# Patient Record
Sex: Female | Born: 1980 | Race: White | Hispanic: No | Marital: Married | Smoking: Never smoker
Health system: Southern US, Community
[De-identification: ages and names within clinical notes are randomized; demographics above are authoritative.]

## PROBLEM LIST (undated history)

## (undated) DIAGNOSIS — M329 Systemic lupus erythematosus, unspecified: Secondary | ICD-10-CM

## (undated) DIAGNOSIS — I82409 Acute embolism and thrombosis of unspecified deep veins of unspecified lower extremity: Secondary | ICD-10-CM

## (undated) DIAGNOSIS — D649 Anemia, unspecified: Secondary | ICD-10-CM

## (undated) DIAGNOSIS — IMO0002 Reserved for concepts with insufficient information to code with codable children: Secondary | ICD-10-CM

## (undated) DIAGNOSIS — H109 Unspecified conjunctivitis: Secondary | ICD-10-CM

## (undated) DIAGNOSIS — I319 Disease of pericardium, unspecified: Secondary | ICD-10-CM

## (undated) DIAGNOSIS — Q211 Atrial septal defect: Secondary | ICD-10-CM

## (undated) HISTORY — DX: Disease of pericardium, unspecified: I31.9

## (undated) HISTORY — PX: APPENDECTOMY: SHX54

## (undated) HISTORY — DX: Atrial septal defect: Q21.1

## (undated) HISTORY — DX: Unspecified conjunctivitis: H10.9

---

## 2002-06-21 ENCOUNTER — Encounter: Admission: RE | Admit: 2002-06-21 | Discharge: 2002-06-21 | Payer: Self-pay | Admitting: Internal Medicine

## 2002-06-21 ENCOUNTER — Encounter: Payer: Self-pay | Admitting: Internal Medicine

## 2003-03-09 ENCOUNTER — Encounter: Admission: RE | Admit: 2003-03-09 | Discharge: 2003-03-09 | Payer: Self-pay | Admitting: Family Medicine

## 2003-03-09 ENCOUNTER — Encounter: Payer: Self-pay | Admitting: Family Medicine

## 2003-06-05 ENCOUNTER — Encounter: Admission: RE | Admit: 2003-06-05 | Discharge: 2003-06-05 | Payer: Self-pay | Admitting: Neurosurgery

## 2003-06-21 ENCOUNTER — Encounter: Admission: RE | Admit: 2003-06-21 | Discharge: 2003-06-21 | Payer: Self-pay | Admitting: Neurosurgery

## 2006-04-25 ENCOUNTER — Encounter: Payer: Self-pay | Admitting: Vascular Surgery

## 2006-04-25 ENCOUNTER — Ambulatory Visit (HOSPITAL_COMMUNITY): Admission: RE | Admit: 2006-04-25 | Discharge: 2006-04-25 | Payer: Self-pay | Admitting: Family Medicine

## 2006-05-11 ENCOUNTER — Encounter: Payer: Self-pay | Admitting: Vascular Surgery

## 2006-05-11 ENCOUNTER — Inpatient Hospital Stay (HOSPITAL_COMMUNITY): Admission: EM | Admit: 2006-05-11 | Discharge: 2006-05-12 | Payer: Self-pay | Admitting: Emergency Medicine

## 2006-05-15 ENCOUNTER — Encounter: Payer: Self-pay | Admitting: Vascular Surgery

## 2006-05-15 ENCOUNTER — Ambulatory Visit (HOSPITAL_COMMUNITY): Admission: RE | Admit: 2006-05-15 | Discharge: 2006-05-15 | Payer: Self-pay | Admitting: Family Medicine

## 2006-10-27 DIAGNOSIS — M329 Systemic lupus erythematosus, unspecified: Secondary | ICD-10-CM

## 2006-10-27 HISTORY — DX: Systemic lupus erythematosus, unspecified: M32.9

## 2008-05-04 ENCOUNTER — Other Ambulatory Visit: Admission: RE | Admit: 2008-05-04 | Discharge: 2008-05-04 | Payer: Self-pay | Admitting: Obstetrics and Gynecology

## 2008-11-24 ENCOUNTER — Encounter: Admission: RE | Admit: 2008-11-24 | Discharge: 2008-11-24 | Payer: Self-pay | Admitting: Family Medicine

## 2009-07-28 DIAGNOSIS — Q2112 Patent foramen ovale: Secondary | ICD-10-CM

## 2009-07-28 DIAGNOSIS — Q211 Atrial septal defect: Secondary | ICD-10-CM

## 2009-07-28 DIAGNOSIS — I253 Aneurysm of heart: Secondary | ICD-10-CM

## 2009-07-28 HISTORY — DX: Aneurysm of heart: I25.3

## 2009-07-28 HISTORY — DX: Patent foramen ovale: Q21.12

## 2009-07-28 HISTORY — DX: Atrial septal defect: Q21.1

## 2010-02-08 ENCOUNTER — Encounter: Admission: RE | Admit: 2010-02-08 | Discharge: 2010-02-08 | Payer: Self-pay | Admitting: Rheumatology

## 2010-04-27 HISTORY — PX: CARDIAC CATHETERIZATION: SHX172

## 2010-05-29 ENCOUNTER — Ambulatory Visit (HOSPITAL_COMMUNITY): Admission: RE | Admit: 2010-05-29 | Discharge: 2010-05-29 | Payer: Self-pay | Admitting: Obstetrics & Gynecology

## 2010-08-26 ENCOUNTER — Ambulatory Visit: Payer: Self-pay | Admitting: Oncology

## 2010-09-04 ENCOUNTER — Encounter (HOSPITAL_BASED_OUTPATIENT_CLINIC_OR_DEPARTMENT_OTHER): Payer: PRIVATE HEALTH INSURANCE | Admitting: Oncology

## 2010-09-04 ENCOUNTER — Other Ambulatory Visit: Payer: Self-pay | Admitting: Oncology

## 2010-09-04 DIAGNOSIS — I824Z9 Acute embolism and thrombosis of unspecified deep veins of unspecified distal lower extremity: Secondary | ICD-10-CM

## 2010-09-04 DIAGNOSIS — L930 Discoid lupus erythematosus: Secondary | ICD-10-CM

## 2010-09-04 LAB — CBC & DIFF AND RETIC
BASO%: 0.2 % (ref 0.0–2.0)
Immature Retic Fract: 4 % (ref 0.00–10.70)
MCH: 31.8 pg (ref 25.1–34.0)
MCHC: 35 g/dL (ref 31.5–36.0)
MCV: 90.8 fL (ref 79.5–101.0)
MONO#: 0.7 10*3/uL (ref 0.1–0.9)
NEUT#: 3.2 10*3/uL (ref 1.5–6.5)
Platelets: 173 10*3/uL (ref 145–400)
RBC: 4.44 10*6/uL (ref 3.70–5.45)
RDW: 12.3 % (ref 11.2–14.5)
Retic %: 1.23 % (ref 0.50–1.50)
Retic Ct Abs: 54.61 10*3/uL (ref 18.30–72.70)
lymph#: 2.5 10*3/uL (ref 0.9–3.3)

## 2010-09-04 LAB — MORPHOLOGY: PLT EST: ADEQUATE

## 2010-09-10 LAB — BETA-2 GLYCOPROTEIN ANTIBODIES
Beta-2 Glyco I IgG: 5 G Units (ref <20)
Beta-2-Glycoprotein I IgM: 14 M Units (ref <20)

## 2010-09-10 LAB — FACTOR 5 LEIDEN

## 2010-09-10 LAB — PROTHROMBIN GENE MUTATION

## 2010-09-10 LAB — LUPUS ANTICOAGULANT PANEL
DRVVT 1:1 Mix: 42.6 secs (ref 36.2–44.3)
Lupus Anticoagulant: NOT DETECTED

## 2010-09-10 LAB — D-DIMER, QUANTITATIVE: D-Dimer, Quant: 0.23 ug/mL-FEU (ref 0.00–0.48)

## 2010-09-11 ENCOUNTER — Encounter: Payer: PRIVATE HEALTH INSURANCE | Admitting: Oncology

## 2010-12-13 NOTE — H&P (Signed)
Katherine Harrington, Katherine Harrington                 ACCOUNT NO.:  [id number]   MEDICAL RECORD NO.:  [id number]          PATIENT TYPE:  INP   LOCATION:  1421                         FACILITY:  [facility]   PHYSICIAN:  Jackie Plum, M.D.DATE OF BIRTH:  1981-03-08   DATE OF ADMISSION:  05/11/2006  DATE OF DISCHARGE:                                HISTORY & PHYSICAL   CHIEF COMPLAINT:  Blood clots in my legs.   HISTORY OF PRESENT ILLNESS:  The patient is a pleasant 30 year old Caucasian  lady who came to the emergency department and was evaluated for this.  Apparently, the patient had some blood clots in her right superficial vein .  During that time, she had a MRI to rule out suspected Baker's cyst. Patient  came back because of a new pain in her left lower extremity. She had an  ultrasound done which was said to have shown a DVT. The patient was sent to  the ED where blood work was done, and she was admitted for further  evaluation. The official report of the ultrasound is pending at the time of  evaluation. At the emergency room, the patient had a CBC ordered which  within normal limits. Her coagulation studies were also within normal  limits. She had a basic metabolic panel which was unremarkable except for  mildly elevated glucose of 104. She is admitted to the hospital for further  evaluation and management.   The patient indicates that she has been using prednisone and estrogen-  impregnated intrauterine device which was taken out recently because of her  right lower extremity clot. She denies any chest pain, shortness of  breath, fevers or chills, abdominal pain, nausea or vomiting, dizziness.   PAST MEDICAL HISTORY:  The patient denies any previous history of  thromboembolic event. She does not have any history of diabetes or  hypertension.   FAMILY HISTORY:  Negative for heart disease or thromboembolism __________ .   SOCIAL HISTORY:  The patient recently graduated from [employer]. She does  not  smoke cigarettes. She drinks alcohol on a social basis.   REVIEW OF SYSTEMS:  As noted above. Otherwise unremarkable.   PHYSICAL EXAMINATION:  VITAL SIGNS:  Were stable.  She was alert and healthy looking. She was not in distress.  CARDIOPULMONARY EXAM:  Unremarkable.  ABDOMEN:  Soft, nontender.  EXTREMITIES:  No cyanosis. She had no swelling of both extremities noted.  However, she had about 1.5 x 1.5 cm nodule behind left knee which was mildly  tender and seems to be associated with the gastrocnemius tendon.  MENTAL STATUS:  Alert and oriented x3.   LABORATORY DATA:  As noted above.   IMPRESSION:  Bilateral suspected vein thrombosis. Right is said to be  superficial, but I am not sure whether the left is deep or not. The patient  will be admitted and started on Lovenox and Coumadin per protocol, and  further recommendations will be made after review of her full official  report of ultrasound done at [facility].      Jackie Plum, M.D.  Electronically Signed  GO/MEDQ  D:  05/12/2006  T:  05/12/2006  Job:  [id number]

## 2010-12-13 NOTE — Discharge Summary (Signed)
Katherine Harrington, Katherine Harrington                 ACCOUNT NO.:  [id number]   MEDICAL RECORD NO.:  [id number]          PATIENT TYPE:  INP   LOCATION:  1421                         FACILITY:  [facility]   PHYSICIAN:  Melissa L. Ladona Ridgel, MD  DATE OF BIRTH:  1980-11-21   DATE OF ADMISSION:  05/11/2006  DATE OF DISCHARGE:  05/12/2006                                 DISCHARGE SUMMARY   CHIEF COMPLAINT AT ADMISSION:  Lower extremity pain.   DISCHARGING DIAGNOSES:  1. 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  Please note that the patient was seen by her primary physician      and recommended for evaluation in the emergency room for possible deep      venous thrombosis secondary to increasing pain.  The patient had      previous evaluation for Baker's cyst which showed no obvious deep      venous thromboses, but over the past couple of weeks her legs have      become more crampy, painful, especially on weightbearing.  The patient      spends most of her time on her feet, and therefore came in for      evaluation.  Her Dopplers were completed in the emergency room, which      showed bilateral clot in the lesser saphenous and gastrocnemius      muscles, but nothing involving any of the popliteal veins.  She      initially was started on Lovenox and Coumadin while awaiting the      official reading of the studies. When it was discovered that the clots      were below the knee, she was changed to aspirin therapy, and will have      a repeat ultrasound by the end of the week.  She was provided with      Percocet for pain, and if her clots were to extend into the popliteal      fossa or above, she then would become a candidate for long-term      anticoagulation.  Please note that the patient underwent a      hypercoagulability panel which appears to all be negative.  This was      faxed to her primary care physician.  2. Chronic back pain.  At this time, this patient has no  issues with this,      and she can be followed as an outpatient.   MEDICATIONS AT THE TIME OF DISCHARGE:  1. Aspirin 325 mg once daily.  2. Percocet 1-2 tablets q.4 h. as needed.   The patient was instructed if she developed increasing pain or swelling in  the lower extremities, or any shortness of breath, she should return to Dr.  Paulino Rily or the emergency room.   She is requested to obtain a repeat ultrasound of the lower extremities  Thursday or Friday of this week and report to her primary care physician for  any anticoagulation that may be necessary if she has extended her clot above  her  popliteal fossa.   On the day of discharge, the patient was clinically stable.  She was awake,  alert, and oriented.  Had no evidence for any bleeding on Lovenox or  Coumadin.  Her vital signs revealed a temperature of 97.4, blood pressure  109/72, pulse 60, respirations 20, saturation 100%.  Generally, this is a  well-developed, well-nourished white female in no acute distress.  She is  normocephalic, atraumatic.  Pupils equal, round, and reactive to light.  Extraocular muscles are intact.  Mucous membranes are moist.  Neck is  supple.  There is no JVD, no lymph nodes, no carotid bruits.  Her chest was  clear to auscultation.  There were no rhonchi, rales, or wheezes.  Cardiovascular is regular rate and rhythm.  Positive S1, S2.  No S3 or S4.  No murmurs, rubs, or gallops.  Abdomen was soft, nontender, nondistended,  with positive bowel sounds.  Extremities showed some tenderness in the calf  area, but no excessive swelling or pitting edema.   I have discussed this case with the patient's primary care physician, and  her discharging laboratory values at the time that she discharged, remained  with an outstanding full hypercoagulability panel that at the time of this  dictation this has returned all negative.  Her discharging iron revealed 48,  which is within normal limits.  Her ESR was 14.   Her discharging PT was  13.6, with an INR of 1.  Her last BUN and creatinine were 11 with a  creatinine of 0.8, and her electrolytes are within normal balance.   At the time, the patient was deemed stable for discharge to follow up with  Dr. Paulino Rily as an outpatient.      Melissa L. Ladona Ridgel, MD  Electronically Signed     MLT/MEDQ  D:  05/13/2006  T:  05/13/2006  Job:  [id number]   cc:   Emeterio Reeve, MD

## 2011-01-02 LAB — ANTIBODY SCREEN: Antibody Screen: NEGATIVE

## 2011-01-02 LAB — HIV ANTIBODY (ROUTINE TESTING W REFLEX): HIV: NONREACTIVE

## 2011-01-02 LAB — RUBELLA ANTIBODY, IGM: Rubella: IMMUNE

## 2011-04-14 ENCOUNTER — Encounter (HOSPITAL_BASED_OUTPATIENT_CLINIC_OR_DEPARTMENT_OTHER): Payer: PRIVATE HEALTH INSURANCE | Admitting: Oncology

## 2011-04-14 DIAGNOSIS — Z86718 Personal history of other venous thrombosis and embolism: Secondary | ICD-10-CM

## 2011-04-14 DIAGNOSIS — I824Z9 Acute embolism and thrombosis of unspecified deep veins of unspecified distal lower extremity: Secondary | ICD-10-CM

## 2011-04-14 DIAGNOSIS — L930 Discoid lupus erythematosus: Secondary | ICD-10-CM

## 2011-05-02 ENCOUNTER — Observation Stay (HOSPITAL_COMMUNITY)
Admission: AD | Admit: 2011-05-02 | Discharge: 2011-05-03 | DRG: 778 | Disposition: A | Discharge status: Home / Self Care | Payer: PRIVATE HEALTH INSURANCE | Source: Ambulatory Visit | Attending: Obstetrics and Gynecology | Admitting: Obstetrics and Gynecology

## 2011-05-02 ENCOUNTER — Encounter (HOSPITAL_COMMUNITY): Payer: Self-pay | Admitting: *Deleted

## 2011-05-02 DIAGNOSIS — Z9289 Personal history of other medical treatment: Secondary | ICD-10-CM

## 2011-05-02 DIAGNOSIS — Y92410 Unspecified street and highway as the place of occurrence of the external cause: Secondary | ICD-10-CM

## 2011-05-02 DIAGNOSIS — M329 Systemic lupus erythematosus, unspecified: Secondary | ICD-10-CM | POA: Diagnosis present

## 2011-05-02 DIAGNOSIS — O2690 Pregnancy related conditions, unspecified, unspecified trimester: Secondary | ICD-10-CM | POA: Diagnosis present

## 2011-05-02 DIAGNOSIS — O4700 False labor before 37 completed weeks of gestation, unspecified trimester: Principal | ICD-10-CM

## 2011-05-02 DIAGNOSIS — O479 False labor, unspecified: Secondary | ICD-10-CM

## 2011-05-02 DIAGNOSIS — O99891 Other specified diseases and conditions complicating pregnancy: Secondary | ICD-10-CM | POA: Diagnosis present

## 2011-05-02 DIAGNOSIS — Z041 Encounter for examination and observation following transport accident: Secondary | ICD-10-CM

## 2011-05-02 DIAGNOSIS — Z86718 Personal history of other venous thrombosis and embolism: Secondary | ICD-10-CM

## 2011-05-02 HISTORY — DX: Acute embolism and thrombosis of unspecified deep veins of unspecified lower extremity: I82.409

## 2011-05-02 HISTORY — DX: Systemic lupus erythematosus, unspecified: M32.9

## 2011-05-02 LAB — URINALYSIS, ROUTINE W REFLEX MICROSCOPIC
Nitrite: NEGATIVE
Protein, ur: NEGATIVE mg/dL
Urobilinogen, UA: 0.2 mg/dL (ref 0.0–1.0)

## 2011-05-02 LAB — URINE MICROSCOPIC-ADD ON

## 2011-05-02 NOTE — Progress Notes (Signed)
Family at bedside.  Pt comfortable.   

## 2011-05-02 NOTE — ED Notes (Signed)
Pitcher of water to pt 

## 2011-05-02 NOTE — Progress Notes (Signed)
Eustace Pen CNM in to see pt. EFM strip reviewed.

## 2011-05-02 NOTE — Progress Notes (Signed)
Pt up to BR. Baby with hiccoughs making it difficult to monitor FHR

## 2011-05-02 NOTE — Progress Notes (Signed)
States drinking water all afternoon. Just ate part of chicken sand. And drinking flds

## 2011-05-02 NOTE — Progress Notes (Signed)
H. Steelman CNM in to see pt. 

## 2011-05-02 NOTE — Progress Notes (Signed)
Pt up to BR
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
## 2011-05-03 ENCOUNTER — Inpatient Hospital Stay (HOSPITAL_COMMUNITY): Payer: PRIVATE HEALTH INSURANCE

## 2011-05-03 DIAGNOSIS — O2690 Pregnancy related conditions, unspecified, unspecified trimester: Secondary | ICD-10-CM | POA: Diagnosis present

## 2011-05-03 DIAGNOSIS — M329 Systemic lupus erythematosus, unspecified: Secondary | ICD-10-CM | POA: Diagnosis present

## 2011-05-03 DIAGNOSIS — Z86718 Personal history of other venous thrombosis and embolism: Secondary | ICD-10-CM

## 2011-05-03 MED ORDER — CALCIUM CARBONATE ANTACID 500 MG PO CHEW: 2.0000 | CHEWABLE_TABLET | ORAL | Status: DC | PRN

## 2011-05-03 MED ORDER — ZOLPIDEM TARTRATE 10 MG PO TABS: 10.0000 mg | ORAL_TABLET | Freq: Every evening | ORAL | Status: DC | PRN

## 2011-05-03 MED ORDER — LACTATED RINGERS IV SOLN
INTRAVENOUS | Status: DC
  Administered 2011-05-03: 03:00:00 via INTRAVENOUS
  Administered 2011-05-03: 1000 mL via INTRAVENOUS

## 2011-05-03 MED ORDER — DOCUSATE SODIUM 100 MG PO CAPS
100.0000 mg | ORAL_CAPSULE | Freq: Every day | ORAL | Status: DC
  Administered 2011-05-03: 100 mg via ORAL
  Filled 2011-05-03: qty 1

## 2011-05-03 MED ORDER — HEPARIN SODIUM (PORCINE) 10000 UNIT/ML IJ SOLN
7500.0000 [IU] | Freq: Two times a day (BID) | INTRAMUSCULAR | Status: DC
  Administered 2011-05-03: 7500 [IU] via SUBCUTANEOUS
  Filled 2011-05-03 (×3): qty 0.75

## 2011-05-03 MED ORDER — PRENATAL PLUS 27-1 MG PO TABS
1.0000 | ORAL_TABLET | Freq: Every day | ORAL | Status: DC
  Administered 2011-05-03: 1 via ORAL
  Filled 2011-05-03: qty 1

## 2011-05-03 MED ORDER — ACETAMINOPHEN 325 MG PO TABS
650.0000 mg | ORAL_TABLET | ORAL | Status: DC | PRN
  Administered 2011-05-03: 650 mg via ORAL
  Filled 2011-05-03: qty 2

## 2011-05-03 NOTE — ED Notes (Signed)
Report called to [facility] in antenatal at [facility]

## 2011-05-03 NOTE — Progress Notes (Signed)
To BS via w/c °

## 2011-05-03 NOTE — Plan of Care (Signed)
Problem: Consults Goal: Birthing Suites Patient Information Press F2 to bring up selections list Outcome: Progressing  Antenatal Patient (< 37 weeks)     Comments:  25.2 weeks
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
## 2011-05-03 NOTE — Progress Notes (Signed)
Katherine Harrington CNM in. FFN obtained and sent and then sve. Questions answered regarding plan of care and admission.

## 2011-05-03 NOTE — Progress Notes (Signed)
Hillary Steelman CNM in to see pt 
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
## 2011-05-14 ENCOUNTER — Other Ambulatory Visit (HOSPITAL_COMMUNITY): Payer: Self-pay | Admitting: Obstetrics and Gynecology

## 2011-05-14 DIAGNOSIS — O2690 Pregnancy related conditions, unspecified, unspecified trimester: Secondary | ICD-10-CM

## 2011-05-20 ENCOUNTER — Ambulatory Visit (HOSPITAL_COMMUNITY)
Admission: RE | Admit: 2011-05-20 | Discharge: 2011-05-20 | Disposition: A | Discharge status: Home / Self Care | Payer: PRIVATE HEALTH INSURANCE | Source: Ambulatory Visit | Attending: Obstetrics and Gynecology | Admitting: Obstetrics and Gynecology

## 2011-05-20 ENCOUNTER — Encounter (HOSPITAL_COMMUNITY): Payer: Self-pay

## 2011-05-20 ENCOUNTER — Other Ambulatory Visit (HOSPITAL_COMMUNITY): Payer: Self-pay | Admitting: Obstetrics and Gynecology

## 2011-05-20 DIAGNOSIS — Z1389 Encounter for screening for other disorder: Secondary | ICD-10-CM | POA: Insufficient documentation

## 2011-05-20 DIAGNOSIS — Z363 Encounter for antenatal screening for malformations: Secondary | ICD-10-CM | POA: Insufficient documentation

## 2011-05-20 DIAGNOSIS — O358XX0 Maternal care for other (suspected) fetal abnormality and damage, not applicable or unspecified: Secondary | ICD-10-CM | POA: Insufficient documentation

## 2011-05-20 DIAGNOSIS — O2690 Pregnancy related conditions, unspecified, unspecified trimester: Secondary | ICD-10-CM

## 2011-05-20 HISTORY — DX: Systemic lupus erythematosus, unspecified: M32.9

## 2011-05-20 HISTORY — DX: Anemia, unspecified: D64.9

## 2011-05-20 HISTORY — DX: Reserved for concepts with insufficient information to code with codable children: IMO0002

## 2011-05-20 NOTE — Progress Notes (Signed)
Patient seen for ultrasound appointment today.  Please see [facility]-OBGYN report for details.  

## 2011-06-03 ENCOUNTER — Ambulatory Visit (HOSPITAL_COMMUNITY)
Admission: RE | Admit: 2011-06-03 | Discharge: 2011-06-03 | Disposition: A | Discharge status: Home / Self Care | Payer: PRIVATE HEALTH INSURANCE | Source: Ambulatory Visit | Attending: Obstetrics and Gynecology | Admitting: Obstetrics and Gynecology

## 2011-06-03 DIAGNOSIS — Z3689 Encounter for other specified antenatal screening: Secondary | ICD-10-CM | POA: Insufficient documentation

## 2011-06-03 DIAGNOSIS — O2690 Pregnancy related conditions, unspecified, unspecified trimester: Secondary | ICD-10-CM

## 2011-06-09 MED ORDER — ACETAMINOPHEN 500 MG PO TABS
ORAL_TABLET | ORAL | Status: AC
  Filled 2011-06-09: qty 1

## 2011-07-04 ENCOUNTER — Other Ambulatory Visit (HOSPITAL_COMMUNITY): Payer: Self-pay | Admitting: Obstetrics and Gynecology

## 2011-07-08 ENCOUNTER — Encounter (HOSPITAL_COMMUNITY): Payer: PRIVATE HEALTH INSURANCE

## 2011-07-14 ENCOUNTER — Encounter: Payer: Self-pay | Admitting: Oncology

## 2011-07-14 ENCOUNTER — Ambulatory Visit (HOSPITAL_BASED_OUTPATIENT_CLINIC_OR_DEPARTMENT_OTHER): Payer: PRIVATE HEALTH INSURANCE | Admitting: Oncology

## 2011-07-14 ENCOUNTER — Telehealth: Payer: Self-pay | Admitting: Oncology

## 2011-07-14 ENCOUNTER — Ambulatory Visit (HOSPITAL_COMMUNITY)
Admission: RE | Admit: 2011-07-14 | Discharge: 2011-07-14 | Disposition: A | Discharge status: Home / Self Care | Payer: PRIVATE HEALTH INSURANCE | Source: Ambulatory Visit | Attending: Obstetrics and Gynecology | Admitting: Obstetrics and Gynecology

## 2011-07-14 DIAGNOSIS — O0990 Supervision of high risk pregnancy, unspecified, unspecified trimester: Secondary | ICD-10-CM | POA: Insufficient documentation

## 2011-07-14 DIAGNOSIS — D689 Coagulation defect, unspecified: Secondary | ICD-10-CM

## 2011-07-14 DIAGNOSIS — R894 Abnormal immunological findings in specimens from other organs, systems and tissues: Secondary | ICD-10-CM

## 2011-07-14 DIAGNOSIS — Z86718 Personal history of other venous thrombosis and embolism: Secondary | ICD-10-CM
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
## 2011-07-14 NOTE — Telephone Encounter (Signed)
gve the pt her June 2013 appt calendar 

## 2011-07-16 ENCOUNTER — Encounter (HOSPITAL_COMMUNITY): Payer: PRIVATE HEALTH INSURANCE

## 2011-07-18 ENCOUNTER — Other Ambulatory Visit: Payer: Self-pay | Admitting: Oncology

## 2011-07-18 NOTE — Progress Notes (Signed)
I called the patient this evening and left a message on her answering machine. I am recommending increasing her post partum Lovenox dose to 40 mg twice daily instead of just 40 mg daily. Given her history of lupus and prior DVTs even though they were 6 years ago I think intermediate dose Lovenox will give her better protection. She is due to deliver in early January. She is encouraged to call me if she has any questions.

## 2011-07-23 LAB — STREP B DNA PROBE: GBS: NEGATIVE

## 2011-07-24 ENCOUNTER — Telehealth (HOSPITAL_COMMUNITY): Payer: Self-pay | Admitting: *Deleted

## 2011-07-24 NOTE — Telephone Encounter (Signed)
Preadmission screen  

## 2011-07-29 ENCOUNTER — Other Ambulatory Visit: Payer: Self-pay | Admitting: Oncology

## 2011-07-29 NOTE — L&D Delivery Note (Signed)
Delivery Note At 4:59 PM a viable female was delivered via Vaginal, Spontaneous Delivery (Presentation: ;  ).  APGAR: 8, 10; weight 6 lb 10.3 oz (3014 g).   Placenta status: intact and spontaneously delivered, .  Cord: 3 vessels with the following complications: None.    Anesthesia: Epidural  Episiotomy: none Lacerations: Bilateral Labial;1st degree vaginal Suture Repair: 2.0 and 3.0 vicryl Est. Blood Loss (mL): 300cc  Mom to postpartum.  Baby to nursery-stable.  Spoke with Dr. Cyndie Chime secondary to patient wanting to use the remainder of her heparin and then start the lovenox 40mg  [place] q12.  He recommends 10,000u heparin sq q12 as she was using in the third trimester.  He did not have strong feelings about recommending the baby aspirin and would leave that to pt discretion.  I informed pt of his recommendations.  Purcell Nails 08/05/2011, 5:35 PM

## 2011-07-30 ENCOUNTER — Other Ambulatory Visit: Payer: Self-pay | Admitting: Oncology

## 2011-07-30 ENCOUNTER — Telehealth: Payer: Self-pay

## 2011-07-30 NOTE — Telephone Encounter (Signed)
Message left on personalized VM for pt to contact office & notify us of what pharmacy to send script for Lovenox.  Script was originally given to pt, but Dr Cyndie Chime decided to change sig from daily to BID.  New script includes appropriate instructions and quantity.  dph

## 2011-07-31 ENCOUNTER — Other Ambulatory Visit: Payer: Self-pay

## 2011-07-31 DIAGNOSIS — I824Z9 Acute embolism and thrombosis of unspecified deep veins of unspecified distal lower extremity: Secondary | ICD-10-CM

## 2011-07-31 MED ORDER — ENOXAPARIN SODIUM 40 MG/0.4ML ~~LOC~~ SOLN: 40.0000 mg | Freq: Two times a day (BID) | SUBCUTANEOUS | Status: DC

## 2011-07-31 NOTE — Telephone Encounter (Signed)
Received return call from pt verbalizing understanding of new instructions.  Lovenox e-prescribed to [facility] per pt request. dph

## 2011-08-01 ENCOUNTER — Other Ambulatory Visit: Payer: Self-pay | Admitting: Obstetrics and Gynecology

## 2011-08-04 ENCOUNTER — Other Ambulatory Visit: Payer: Self-pay | Admitting: Obstetrics and Gynecology

## 2011-08-04 ENCOUNTER — Inpatient Hospital Stay (HOSPITAL_COMMUNITY)
Admission: RE | Admit: 2011-08-04 | Discharge: 2011-08-07 | DRG: 775 | Disposition: A | Discharge status: Home / Self Care | Payer: PRIVATE HEALTH INSURANCE | Source: Ambulatory Visit | Attending: Obstetrics and Gynecology | Admitting: Obstetrics and Gynecology

## 2011-08-04 ENCOUNTER — Encounter (HOSPITAL_COMMUNITY): Payer: Self-pay

## 2011-08-04 DIAGNOSIS — Q211 Atrial septal defect: Secondary | ICD-10-CM

## 2011-08-04 DIAGNOSIS — O99892 Other specified diseases and conditions complicating childbirth: Principal | ICD-10-CM | POA: Diagnosis present

## 2011-08-04 DIAGNOSIS — Z880 Allergy status to penicillin: Secondary | ICD-10-CM

## 2011-08-04 DIAGNOSIS — Z86718 Personal history of other venous thrombosis and embolism: Secondary | ICD-10-CM

## 2011-08-04 DIAGNOSIS — Q2112 Patent foramen ovale: Secondary | ICD-10-CM

## 2011-08-04 DIAGNOSIS — M329 Systemic lupus erythematosus, unspecified: Secondary | ICD-10-CM

## 2011-08-04 LAB — CBC
MCH: 32.9 pg (ref 26.0–34.0)
MCV: 95 fL (ref 78.0–100.0)
Platelets: 179 10*3/uL (ref 150–400)
RBC: 4.04 MIL/uL (ref 3.87–5.11)
RDW: 12.7 % (ref 11.5–15.5)

## 2011-08-04 LAB — PROTIME-INR
INR: 0.9 (ref 0.00–1.49)
Prothrombin Time: 12.3 seconds (ref 11.6–15.2)

## 2011-08-04 MED ORDER — OXYCODONE-ACETAMINOPHEN 5-325 MG PO TABS: 2.0000 | ORAL_TABLET | ORAL | Status: DC | PRN

## 2011-08-04 MED ORDER — LACTATED RINGERS IV SOLN: INTRAVENOUS | Status: DC

## 2011-08-04 MED ORDER — HYDROXYCHLOROQUINE SULFATE 200 MG PO TABS
200.0000 mg | ORAL_TABLET | Freq: Every day | ORAL | Status: DC
  Administered 2011-08-05 – 2011-08-07 (×3): 200 mg via ORAL
  Filled 2011-08-04 (×5): qty 1

## 2011-08-04 MED ORDER — LIDOCAINE HCL (PF) 1 % IJ SOLN
30.0000 mL | INTRAMUSCULAR | Status: DC | PRN
  Filled 2011-08-04: qty 30

## 2011-08-04 MED ORDER — ONDANSETRON HCL 4 MG/2ML IJ SOLN
4.0000 mg | Freq: Four times a day (QID) | INTRAMUSCULAR | Status: DC | PRN
  Filled 2011-08-04 (×3): qty 2

## 2011-08-04 MED ORDER — TERBUTALINE SULFATE 1 MG/ML IJ SOLN: 0.2500 mg | Freq: Once | INTRAMUSCULAR | Status: AC | PRN

## 2011-08-04 MED ORDER — CITRIC ACID-SODIUM CITRATE 334-500 MG/5ML PO SOLN: 30.0000 mL | ORAL | Status: DC | PRN

## 2011-08-04 MED ORDER — ZOLPIDEM TARTRATE 10 MG PO TABS
10.0000 mg | ORAL_TABLET | Freq: Every evening | ORAL | Status: DC | PRN
  Administered 2011-08-04: 10 mg via ORAL
  Filled 2011-08-04: qty 1

## 2011-08-04 MED ORDER — OXYTOCIN 10 UNIT/ML IJ SOLN: 10.0000 [IU] | Freq: Once | INTRAMUSCULAR | Status: DC

## 2011-08-04 MED ORDER — OXYTOCIN 20 UNITS IN LACTATED RINGERS INFUSION - SIMPLE
1.0000 m[IU]/min | INTRAVENOUS | Status: DC
  Administered 2011-08-05: 2 m[IU]/min via INTRAVENOUS
  Filled 2011-08-04: qty 1000

## 2011-08-04 MED ORDER — IBUPROFEN 600 MG PO TABS: 600.0000 mg | ORAL_TABLET | Freq: Four times a day (QID) | ORAL | Status: DC | PRN

## 2011-08-04 MED ORDER — OXYTOCIN BOLUS FROM INFUSION
500.0000 mL | Freq: Once | INTRAVENOUS | Status: DC
  Filled 2011-08-04: qty 500

## 2011-08-04 MED ORDER — CALCIUM CARBONATE ANTACID 500 MG PO CHEW
2.0000 | CHEWABLE_TABLET | Freq: Every day | ORAL | Status: DC
  Filled 2011-08-04: qty 1

## 2011-08-04 MED ORDER — OXYTOCIN 20 UNITS IN LACTATED RINGERS INFUSION - SIMPLE: 125.0000 mL/h | Freq: Once | INTRAVENOUS | Status: DC

## 2011-08-04 MED ORDER — ACETAMINOPHEN 325 MG PO TABS: 650.0000 mg | ORAL_TABLET | ORAL | Status: DC | PRN

## 2011-08-04 MED ORDER — DINOPROSTONE 10 MG VA INST
10.0000 mg | VAGINAL_INSERT | Freq: Once | VAGINAL | Status: AC
  Administered 2011-08-04: 10 mg via VAGINAL
  Filled 2011-08-04: qty 1

## 2011-08-04 MED ORDER — BUTORPHANOL TARTRATE 2 MG/ML IJ SOLN: 1.0000 mg | Freq: Every day | INTRAMUSCULAR | Status: AC | PRN

## 2011-08-04 MED ORDER — LACTATED RINGERS IV SOLN: 500.0000 mL | INTRAVENOUS | Status: DC | PRN

## 2011-08-05 ENCOUNTER — Encounter (HOSPITAL_COMMUNITY): Payer: Self-pay

## 2011-08-05 ENCOUNTER — Encounter (HOSPITAL_COMMUNITY): Payer: Self-pay | Admitting: Anesthesiology

## 2011-08-05 ENCOUNTER — Inpatient Hospital Stay (HOSPITAL_COMMUNITY): Payer: PRIVATE HEALTH INSURANCE | Admitting: Anesthesiology

## 2011-08-05 DIAGNOSIS — Z880 Allergy status to penicillin: Secondary | ICD-10-CM

## 2011-08-05 DIAGNOSIS — M329 Systemic lupus erythematosus, unspecified: Secondary | ICD-10-CM

## 2011-08-05 DIAGNOSIS — Z86718 Personal history of other venous thrombosis and embolism: Secondary | ICD-10-CM

## 2011-08-05 DIAGNOSIS — Q211 Atrial septal defect: Secondary | ICD-10-CM

## 2011-08-05 MED ORDER — SIMETHICONE 80 MG PO CHEW: 80.0000 mg | CHEWABLE_TABLET | ORAL | Status: DC | PRN

## 2011-08-05 MED ORDER — DIPHENHYDRAMINE HCL 25 MG PO CAPS: 25.0000 mg | ORAL_CAPSULE | Freq: Four times a day (QID) | ORAL | Status: DC | PRN

## 2011-08-05 MED ORDER — ONDANSETRON HCL 4 MG PO TABS: 4.0000 mg | ORAL_TABLET | ORAL | Status: DC | PRN

## 2011-08-05 MED ORDER — SENNOSIDES-DOCUSATE SODIUM 8.6-50 MG PO TABS
2.0000 | ORAL_TABLET | Freq: Every day | ORAL | Status: DC
  Administered 2011-08-05 – 2011-08-06 (×2): 2 via ORAL

## 2011-08-05 MED ORDER — TETANUS-DIPHTH-ACELL PERTUSSIS 5-2.5-18.5 LF-MCG/0.5 IM SUSP
0.5000 mL | Freq: Once | INTRAMUSCULAR | Status: DC
  Filled 2011-08-05: qty 0.5

## 2011-08-05 MED ORDER — ZOLPIDEM TARTRATE 5 MG PO TABS: 5.0000 mg | ORAL_TABLET | Freq: Every evening | ORAL | Status: DC | PRN

## 2011-08-05 MED ORDER — IBUPROFEN 600 MG PO TABS: 600.0000 mg | ORAL_TABLET | Freq: Four times a day (QID) | ORAL | Status: DC

## 2011-08-05 MED ORDER — ONDANSETRON HCL 4 MG/2ML IJ SOLN
4.0000 mg | Freq: Once | INTRAMUSCULAR | Status: AC
  Administered 2011-08-05: 4 mg via INTRAVENOUS

## 2011-08-05 MED ORDER — LIDOCAINE HCL 1.5 % IJ SOLN
INTRAMUSCULAR | Status: DC | PRN
  Administered 2011-08-05 (×2): 5 mL via EPIDURAL

## 2011-08-05 MED ORDER — PHENYLEPHRINE 40 MCG/ML (10ML) SYRINGE FOR IV PUSH (FOR BLOOD PRESSURE SUPPORT): 80.0000 ug | PREFILLED_SYRINGE | INTRAVENOUS | Status: DC | PRN

## 2011-08-05 MED ORDER — WITCH HAZEL-GLYCERIN EX PADS: 1.0000 "application " | MEDICATED_PAD | CUTANEOUS | Status: DC | PRN

## 2011-08-05 MED ORDER — LACTATED RINGERS IV SOLN: 500.0000 mL | Freq: Once | INTRAVENOUS | Status: DC

## 2011-08-05 MED ORDER — OXYCODONE-ACETAMINOPHEN 5-325 MG PO TABS
1.0000 | ORAL_TABLET | ORAL | Status: DC | PRN
  Administered 2011-08-05: 1 via ORAL
  Administered 2011-08-06 (×3): 2 via ORAL
  Administered 2011-08-06 (×2): 1 via ORAL
  Administered 2011-08-07 (×3): 2 via ORAL
  Filled 2011-08-05: qty 2
  Filled 2011-08-05 (×2): qty 1
  Filled 2011-08-05 (×2): qty 2
  Filled 2011-08-05: qty 1
  Filled 2011-08-05 (×2): qty 2
  Filled 2011-08-05 (×2): qty 1

## 2011-08-05 MED ORDER — PRENATAL MULTIVITAMIN CH
1.0000 | ORAL_TABLET | Freq: Every day | ORAL | Status: DC
  Administered 2011-08-06 – 2011-08-07 (×2): 1 via ORAL
  Filled 2011-08-05 (×3): qty 1

## 2011-08-05 MED ORDER — FENTANYL 2.5 MCG/ML BUPIVACAINE 1/10 % EPIDURAL INFUSION (WH - ANES)
INTRAMUSCULAR | Status: DC | PRN
  Administered 2011-08-05: 14 mL/h via EPIDURAL

## 2011-08-05 MED ORDER — BENZOCAINE-MENTHOL 20-0.5 % EX AERO
INHALATION_SPRAY | CUTANEOUS | Status: AC
  Administered 2011-08-06: 1 via TOPICAL
  Filled 2011-08-05: qty 56

## 2011-08-05 MED ORDER — DIBUCAINE 1 % RE OINT: 1.0000 "application " | TOPICAL_OINTMENT | RECTAL | Status: DC | PRN

## 2011-08-05 MED ORDER — ENOXAPARIN SODIUM 40 MG/0.4ML ~~LOC~~ SOLN: 40.0000 mg | Freq: Two times a day (BID) | SUBCUTANEOUS | Status: DC

## 2011-08-05 MED ORDER — DIPHENHYDRAMINE HCL 50 MG/ML IJ SOLN: 12.5000 mg | INTRAMUSCULAR | Status: DC | PRN

## 2011-08-05 MED ORDER — BENZOCAINE-MENTHOL 20-0.5 % EX AERO
1.0000 "application " | INHALATION_SPRAY | CUTANEOUS | Status: DC | PRN
  Administered 2011-08-06: 1 via TOPICAL

## 2011-08-05 MED ORDER — FENTANYL 2.5 MCG/ML BUPIVACAINE 1/10 % EPIDURAL INFUSION (WH - ANES)
14.0000 mL/h | INTRAMUSCULAR | Status: DC
  Administered 2011-08-05: 14 mL/h via EPIDURAL
  Filled 2011-08-05 (×2): qty 60

## 2011-08-05 MED ORDER — EPHEDRINE 5 MG/ML INJ
10.0000 mg | INTRAVENOUS | Status: DC | PRN
  Administered 2011-08-05: 10 mg via INTRAVENOUS
  Filled 2011-08-05: qty 4

## 2011-08-05 MED ORDER — ONDANSETRON HCL 4 MG/2ML IJ SOLN: 4.0000 mg | INTRAMUSCULAR | Status: DC | PRN

## 2011-08-05 MED ORDER — HEPARIN SODIUM (PORCINE) 10000 UNIT/ML IJ SOLN
10000.0000 [IU] | Freq: Two times a day (BID) | INTRAMUSCULAR | Status: DC
  Administered 2011-08-06 – 2011-08-07 (×3): 10000 [IU] via SUBCUTANEOUS
  Filled 2011-08-05 (×3): qty 1
  Filled 2011-08-05: qty 2
  Filled 2011-08-05: qty 1

## 2011-08-05 MED ORDER — EPHEDRINE 5 MG/ML INJ: 10.0000 mg | INTRAVENOUS | Status: DC | PRN

## 2011-08-05 MED ORDER — LANOLIN HYDROUS EX OINT: TOPICAL_OINTMENT | CUTANEOUS | Status: DC | PRN

## 2011-08-05 MED ORDER — TERBUTALINE SULFATE 1 MG/ML IJ SOLN: 0.2500 mg | Freq: Once | INTRAMUSCULAR | Status: DC | PRN

## 2011-08-05 NOTE — Anesthesia Preprocedure Evaluation (Signed)
Anesthesia Evaluation  Patient identified by MRN, date of birth, ID band Patient awake    Reviewed: Allergy & Precautions, H&P , Patient's Chart, lab work & pertinent test results  Airway Mallampati: III TM Distance: >3 FB Neck ROM: full    Dental No notable dental hx. (+) Teeth Intact   Pulmonary neg pulmonary ROS,  clear to auscultation  Pulmonary exam normal       Cardiovascular regular Normal Had Patent Foramen Ovale S/P patch graft via Cath.   Neuro/Psych Negative Neurological ROS  Negative Psych ROS   GI/Hepatic negative GI ROS, Neg liver ROS,   Endo/Other  Negative Endocrine ROS  Renal/GU negative Renal ROS  Genitourinary negative   Musculoskeletal   Abdominal   Peds  Hematology negative hematology ROS (+) Lupus Anticoagulant positive. Had DVT Right leg 2007, was on Lovenox and Coumadin for 6 months Has been on Baby ASA since. Was on SQ unfractionated heparin BID during pregnancy. Last dose greater than 24 hours ago.   Anesthesia Other Findings Hx/o SLE  Reproductive/Obstetrics (+) Pregnancy                           Anesthesia Physical Anesthesia Plan  ASA: II  Anesthesia Plan: Epidural   Post-op Pain Management:    Induction:   Airway Management Planned:   Additional Equipment:   Intra-op Plan:   Post-operative Plan:   Informed Consent: I have reviewed the patients History and Physical, chart, labs and discussed the procedure including the risks, benefits and alternatives for the proposed anesthesia with the patient or authorized representative who has indicated his/her understanding and acceptance.     Plan Discussed with: Anesthesiologist  Anesthesia Plan Comments:         Anesthesia Quick Evaluation

## 2011-08-05 NOTE — Progress Notes (Signed)
SIMRANJIT THAYER is a 31 y.o. G1P0000 at [redacted]w[redacted]d admitted for induction.  Subjective:   Objective: BP 121/78  Pulse 80  Temp(Src) 98.3 F (36.8 C) (Oral)  Resp 18  Ht 5\' 1"  (1.549 m)  Wt 63.957 kg (141 lb)  BMI 26.64 kg/m2  SpO2 100%  LMP 11/07/2010      FHT:  FHR: 120 bpm, variability: moderate,  accelerations:  Present,  decelerations:  Absent UC:   regular, every 1-2 minutes SVE:   Dilation: Lip/rim Effacement (%): 100 Station: 0 Exam by:: Garnell Begeman  Labs: Lab Results  Component Value Date   WBC 12.8* 08/04/2011   HGB 13.3 08/04/2011   HCT 38.4 08/04/2011   MCV 95.0 08/04/2011   PLT 179 08/04/2011    Assessment / Plan: Spontaneous labor, progressing normally.  Fetal status is overall reassuring.  Purcell Nails 08/05/2011, 3:44 PM

## 2011-08-05 NOTE — H&P (Signed)
Katherine Harrington is a 31 y.o. female presenting for elective IOL secondary to hx DVT, SLE, has been on heparin sq during preg, has also been followed by MFM for consultation. She denies ctx, VB, LOF, +FM.  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 Hx anemia 3. SLE +SSA 4. Hx PFO - repaired, pt had normal echo during pregnancy 5. Hx DVT x2  HPI: Pt began prenatal care at 10wks at [facility]. She was followed by the MD service, as well as MFM, she received serial fetal echo's, and growth [place] that were normal. She continued to stay on heparin sq BID, and plaquenil QD, at 26wks 1hr gtt was 69, RPR =NR and hgb 11.9. She continued to have a normal prenatal course.   Maternal Medical History:  Reason for admission: IOL  Contractions: denies  Fetal activity: Perceived fetal activity is normal.   Last perceived fetal movement was within the past hour.    Prenatal complications: no prenatal complications   OB History    Grav Para Term Preterm Abortions TAB SAB Ect Mult Living   1 0 0 0 0 0 0 0 0 0      Past Medical History  Diagnosis Date  . Lupus (systemic lupus erythematosus) 10/2006  . DVT (deep venous thrombosis)   . Anemia   . Lupus   . Supervision of high-risk pregnancy 07/14/2011   Past Surgical History  Procedure Date  . Appendectomy   . Cardiac catheterization 04/2010    To close PFO   Family History: family history is not on file. CAD - MGM, PGF CHTN - MGM, PGF Hypothyroid - mother Lupus - aunt Stroke - MGF Alcoholic - MGF  Social History:  reports that she has never smoked. She has never used smokeless tobacco. She reports that she does not drink alcohol or use illicit drugs.  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.  Review of Systems  All other systems reviewed and are negative.    Dilation: 2 Effacement (%): 80 Station: -2 Exam by:: l.poore, rn Blood pressure 109/69, pulse 88, temperature 97.9 F (36.6 C), temperature source Axillary, resp.  rate 20, height 5\' 1"  (1.549 Harrington), weight 63.957 kg (141 lb), last menstrual period 11/07/2010. Maternal Exam:  Uterine Assessment: Contraction strength is mild.  Contraction frequency is rare.   Abdomen: Patient reports no abdominal tenderness. Fundal height is aga.   Estimated fetal weight is 7-11.   Fetal presentation: vertex  Introitus: Normal vulva. Normal vagina.  Vagina is negative for discharge.  Ferning test: not done.   Pelvis: adequate for delivery.   Cervix: Cervix evaluated by digital exam.     Fetal Exam Fetal Monitor Review: Mode: ultrasound.   Baseline rate: 130.  Variability: moderate (6-25 bpm).   Pattern: accelerations present and no decelerations.    Fetal State Assessment: Category I - tracings are normal.     Physical Exam  Nursing note and vitals reviewed. Constitutional: She is oriented to person, place, and time. She appears well-developed and well-nourished.  Cardiovascular: Normal rate, regular rhythm and normal heart sounds.   Respiratory: Effort normal and breath sounds normal.  GI: Soft. Bowel sounds are normal.  Genitourinary: Vagina normal. No vaginal discharge found.  Musculoskeletal: Normal range of motion. She exhibits no edema and no tenderness.  Neurological: She is alert and oriented to person, place, and time. She has normal reflexes.  Skin: Skin is warm and dry.  Psychiatric: She has a normal  mood and affect. Her behavior is normal. Judgment and thought content normal.    Prenatal labs: ABO, Rh: A/Positive/-- (06/07 0000) Antibody: Negative (06/07 0000) Rubella: Immune (06/07 0000) RPR: NON REACTIVE (01/07 2136)  HBsAg: Negative (06/07 0000)  HIV: Non-reactive (06/07 0000)  GBS: Negative (12/26 0000)  Declined genetic screens    Assessment/Plan: IUP at [redacted]w[redacted]d GBS neg FHR reassuring Favorable cervix SLE +SSA Hx DVT x2 Hx PFO - repaired  Admit to birthing suites - Dr Estanislado Pandy attending, MD care Dr. Su Hilt primary Routine MD  orders Cervidil for cervical ripening - pitocin in the AM Coag panel SCD's while in bed Anesthesia PRN     Katherine Harrington 08/05/2011, 1:33 AM

## 2011-08-05 NOTE — Progress Notes (Signed)
SCDs applied.

## 2011-08-05 NOTE — Consult Note (Signed)
Please see recent office notes. 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 She has had variably positive blood tests for presence of  a lupus type anticoagulant with most recent testing negative. She tests negative for antibodies to beta-2-glycoprotein-1 felt to be most specific test for antiphospholipid antibodies. She tests negative for the prothrombin and factor V Leiden gene mutations.  She has not been on long term anticoagulation after initial events in 2007.  She is now at term of her first pregnancy. Pregnancy has proceeded without incident. She was taking unfractionated SQ heparin BID 7,500 units 1st 2 trimesters then 10,000 units last trimester.She was admitted 08/04/11 for a planned induction of labor. Heparin on hold. Impression: Increased thrombotic risk based on above history. Recommend: Intermediate dos low molecular weight heparin to begin 12 hours after uncomplicated vaginal delivery or 24 hours after a C-section if otherwise stable:  40 mg SQ Q12h to continue for 6 weeks after delivery. I would also resume low dose aspirin 81 mg daily. Thanks  please call if any questions [phone]

## 2011-08-05 NOTE — Progress Notes (Signed)
Katherine Harrington is a 31 y.o. G1P0000 at 108w5d admitted for induction of labor.  Subjective: No complaints  Objective: BP 91/50  Pulse 70  Temp(Src) 98.4 F (36.9 C) (Oral)  Resp 16  Ht 5\' 1"  (1.549 m)  Wt 63.957 kg (141 lb)  BMI 26.64 kg/m2  SpO2 100%  LMP 11/07/2010      FHT:  FHR: 120s-130s bpm, variability: moderate,  accelerations:  Present,  decelerations:  Absent s/p decel about an hour ago.  Pitocin d/c'd by RN. UC:   regular, every 3 minutes SVE:   Dilation: 3 Effacement (%): 80 Station: -2 Exam by:: Katherine Harrington AROM clear fluid  Labs: Lab Results  Component Value Date   WBC 12.8* 08/04/2011   HGB 13.3 08/04/2011   HCT 38.4 08/04/2011   MCV 95.0 08/04/2011   PLT 179 08/04/2011    Assessment / Plan: Undergoing indyuction doing well.  Will reeval in about an hour and consider restarting pitocin at 1mu. Fetal status is overall reassuring.   Katherine Harrington Y 08/05/2011, 1:12 PM

## 2011-08-05 NOTE — Progress Notes (Signed)
Katherine Harrington is a 31 y.o. G1P0000 at [redacted]w[redacted]d admitted for induction of labor due to multiple medical problems...lupus, +SSA Ab and h/o DVT on heparin throughout pregnancy d/c'd 24hrs prior to delivery.  Subjective: No complaints.  Comfortable s/p epidural.  Objective: BP 106/60  Pulse 88  Temp(Src) 98.5 F (36.9 C) (Oral)  Resp 16  Ht 5\' 1"  (1.549 m)  Wt 63.957 kg (141 lb)  BMI 26.64 kg/m2  SpO2 100%  LMP 11/07/2010      FHT:  FHR: 120s bpm, variability: moderate,  accelerations:  Present,  decelerations:  Absent UC:   regular, every 2-3 minutes SVE:   Dilation: 3 Effacement (%): 60 Station: -1 Exam by:: Dr. Su Hilt Vtx  Labs: Lab Results  Component Value Date   WBC 12.8* 08/04/2011   HGB 13.3 08/04/2011   HCT 38.4 08/04/2011   MCV 95.0 08/04/2011   PLT 179 08/04/2011    Assessment / Plan: Induction of labor due to above.  Starting pitocin now s/p cervidil overnight.  Fetal status is overall reassuring.  Pain controlled with epidural.   Kostantinos Tallman Y 08/05/2011, 11:03 AM

## 2011-08-05 NOTE — Anesthesia Procedure Notes (Signed)
Epidural Patient location during procedure: OB Start time: 08/05/2011 10:10 AM End time: 08/05/2011 10:15 AM Reason for block: procedure for pain  Staffing Anesthesiologist: Sandrea Hughs Performed by: anesthesiologist   Preanesthetic Checklist Completed: patient identified, site marked, surgical consent, pre-op evaluation, timeout performed, IV checked, risks and benefits discussed and monitors and equipment checked  Epidural Patient position: sitting Prep: site prepped and draped and DuraPrep Patient monitoring: continuous pulse ox and blood pressure Approach: midline Injection technique: LOR air  Needle:  Needle type: Tuohy  Needle gauge: 17 G Needle length: 9 cm Needle insertion depth: 5 cm cm Catheter type: closed end flexible Catheter size: 19 Gauge Catheter at skin depth: 10 cm Test dose: negative and 1.5% lidocaine  Assessment Sensory level: T8 Events: blood not aspirated, injection not painful, no injection resistance, negative IV test and no paresthesia

## 2011-08-05 NOTE — Plan of Care (Signed)
Problem: Consults Goal: Birthing Suites Patient Information Press F2 to bring up selections list Outcome: Completed/Met Date Met:  08/05/11  Pt 37-[redacted] weeks EGA, Inpatient induction and Other (specify with a note) maternal history & high risk pregnancy

## 2011-08-06 ENCOUNTER — Encounter (HOSPITAL_COMMUNITY): Payer: Self-pay | Admitting: Anesthesiology

## 2011-08-06 LAB — CBC
HCT: 31.5 % — ABNORMAL LOW (ref 36.0–46.0)
Hemoglobin: 10.6 g/dL — ABNORMAL LOW (ref 12.0–15.0)
MCH: 32 pg (ref 26.0–34.0)
MCV: 95.2 fL (ref 78.0–100.0)
RBC: 3.31 MIL/uL — ABNORMAL LOW (ref 3.87–5.11)
WBC: 11.7 10*3/uL — ABNORMAL HIGH (ref 4.0–10.5)

## 2011-08-06 NOTE — Progress Notes (Signed)
Uncomplicated vaginal delivery at 5 PM 08/05/11; healthy female infant; Lungs clear; heart: regular rhythm; Extremities: pneumatic cuffs; calves non-tender. Hemoglobin down from 13.3 to 10.6 Impression: Lupus patient; prior hx DVT stable post delivery day 1 Recommendation: Discussed with Dr Su Hilt - patient would like to continue her unfractionated heparin since she has supply at home.  Will start 10,000 units SQ BID @ 10 AM today. I will check a peak level 6 hours after 2nd dose to guide any subsequent dose adjustments. Continue calcium & vitamin D supplements. She will resume her low dose ASA 81 mg.

## 2011-08-06 NOTE — Progress Notes (Addendum)
Post Partum Day 1 Subjective: no complaints.  Up ad lib, no syncope or dizziness. SCDs on while in bed.  Using Percocet for pain with benefit.  Dr. Cyndie Chime in to see patient this am.  Breastfeeding.  Objective: Blood pressure 108/68, pulse 82, temperature 98.5 F (36.9 C), temperature source Oral, resp. rate 18, height 5\' 1"  (1.549 m), weight 63.957 kg (141 lb), last menstrual period 11/07/2010, SpO2 100.00%, unknown if currently breastfeeding.  Physical Exam:  General: alert Lochia: appropriate Uterine Fundus: firm Incision: healing well DVT Evaluation: No evidence of DVT seen on physical exam. Negative Homan's sign.   Basename 08/06/11 0550 08/04/11 2136  HGB 10.6* 13.3  HCT 31.5* 38.4    Assessment/Plan: Plan for discharge tomorrow. Per Dr. Cyndie Chime note, will start 10,000 units SQ BID @ 10 AM today (since patient has heparin at home).  Check a peak level 6 hours after 2nd dose to guide any subsequent dose adjustments per his order.  Continue calcium & vitamin D supplements.  She will resume her low dose ASA 81 mg.    LOS: 2 days   Katherine Harrington 08/06/2011, 8:15 AM

## 2011-08-06 NOTE — Anesthesia Postprocedure Evaluation (Signed)
  Anesthesia Post-op Note  Patient: Katherine Harrington  Procedure(s) Performed: * No surgery found *  Patient Location: mother baby  Anesthesia Type: Epidural  Level of Consciousness: awake, alert  and oriented  Airway and Oxygen Therapy: Patient Spontanous Breathing  Post-op Pain: mild  Post-op Assessment: Post-op Vital signs reviewed  Post-op Vital Signs: stable  Complications: No apparent anesthesia complications

## 2011-08-06 NOTE — Anesthesia Postprocedure Evaluation (Signed)
Anesthesia Post Note  Patient: Katherine Harrington  Procedure(s) Performed: * No procedures listed *  Anesthesia type: Epidural  Patient location: Mother/Baby  Post pain: Pain level controlled  Post assessment: Post-op Vital signs reviewed  Last Vitals:  Filed Vitals:   08/05/11 2155  BP: 108/68  Pulse: 82  Temp: 36.9 C  Resp: 18    Post vital signs: Reviewed  Level of consciousness: awake  Complications: No apparent anesthesia complications

## 2011-08-07 ENCOUNTER — Inpatient Hospital Stay (HOSPITAL_COMMUNITY): Admission: AD | Admit: 2011-08-07 | Payer: Self-pay | Source: Ambulatory Visit | Admitting: Obstetrics and Gynecology

## 2011-08-07 LAB — HEPARIN LEVEL (UNFRACTIONATED): Heparin Unfractionated: <0.1 IU/mL — ABNORMAL LOW (ref 0.30–0.70)

## 2011-08-07 LAB — APTT: aPTT: 42 seconds — ABNORMAL HIGH (ref 24–37)

## 2011-08-07 MED ORDER — OXYCODONE-ACETAMINOPHEN 5-325 MG PO TABS: 1.0000 | ORAL_TABLET | ORAL | Status: AC | PRN

## 2011-08-07 NOTE — Progress Notes (Signed)
PTT 42  seconds  Heparin peak level <0.1 Pre-pregnant weight 56 kg Current heparin dose 10,000 units SQ Q12 hours Therapeutic dose 250 units/kg = 14,000 units Q 12 h Current dose is an acceptable prophylactic dose.

## 2011-08-07 NOTE — Discharge Summary (Signed)
Obstetric Discharge Summary Reason for Admission: induction of labor due to lupus, hx DVT Prenatal Procedures: NST, ultrasound and anticoagulant therapy Intrapartum Procedures: spontaneous vaginal delivery Postpartum Procedures: Heparin SQ Complications-Operative and Postpartum: 1st degree vaginal  Temp:  [98.2 F (36.8 C)-98.7 F (37.1 C)] 98.2 F (36.8 C) (01/10 0600) Pulse Rate:  [73-84] 74  (01/10 0600) Resp:  [18] 18  (01/10 0600) BP: (95-108)/(60-72) 95/60 mmHg (01/10 0600) SpO2:  [96 %] 96 % (01/09 1157) HGB  Date Value Range Status  09/04/2010 14.1  11.6-15.9 (g/dL) Final     Hemoglobin  Date Value Range Status  08/06/2011 10.6* 12.0-15.0 (g/dL) Final     DELTA CHECK NOTED     REPEATED TO VERIFY     HCT  Date Value Range Status  08/06/2011 31.5* 36.0-46.0 (%) Final  09/04/2010 40.3  34.8-46.6 (%) Final   Results for orders placed during the hospital encounter of 08/04/11 (from the past 24 hour(s))  HEPARIN LEVEL (UNFRACTIONATED)     Status: Abnormal   Collection Time   08/07/11  4:00 AM      Component Value Range   Heparin Unfractionated <0.10 (*) 0.30 - 0.70 (IU/mL)  APTT     Status: Abnormal   Collection Time   08/07/11  4:00 AM      Component Value Range   aPTT 42 (*) 24 - 37 (seconds)     Hospital Course:  Hospital Course: Admitted in for induction 08/04/11 for lupus, hx DVT, +SSA antibodies.  Negative GBS.  Had been on anticoagulant therapy  Progressed to fully dilated with augmentation and an epidural. Delivery was performed by Dr. Su Hilt without difficulty. Patient and baby tolerated the procedure without difficulty, with a 1st degree vaginal laceration noted. Infant to FTN. Mother and infant then had an uncomplicated postpartum course, with breast feeding going slowly due to sleepy baby. Mom's physical exam was WNL, and she was discharged home in stable condition. Contraception plan was Mirena.  She received adequate benefit from po pain medications, and was using  Percocet--no Ibuprophen due to anticoagulant therapy.  Patient requested to remain on Heparin for anticoagulant therapy, and Dr. Cyndie Chime was in agreement--he saw the patient during her postpartum course, and planned to continue following her after discharge.  Discharge Diagnoses: Term Pregnancy-delivered and lupus, hx DVT, +SSA  Discharge Information: Date: 08/07/2011 Activity: Per [facility] handout Diet: routine Medications:  Medication List  As of 08/07/2011  8:26 AM   START taking these medications         oxyCODONE-acetaminophen 5-325 MG per tablet   Commonly known as: PERCOCET   Take 1-2 tablets by mouth every 3 (three) hours as needed (moderate - severe pain).         CONTINUE taking these medications         acetaminophen 500 MG tablet   Commonly known as: TYLENOL      calcium carbonate 600 MG Tabs   Commonly known as: OS-CAL      calcium carbonate 750 MG chewable tablet   Commonly known as: TUMS EX      diphenhydrAMINE 25 MG tablet   Commonly known as: BENADRYL      heparin 16109 UNIT/ML injection      hydroxychloroquine 200 MG tablet   Commonly known as: PLAQUENIL      prenatal vitamin w/FE, FA 27-1 MG Tabs         STOP taking these medications         diphenhydramine-acetaminophen 25-500 MG Tabs  enoxaparin 40 MG/0.4ML Soln          Where to get your medications    These are the prescriptions that you need to pick up.   You may get these medications from any pharmacy.         oxyCODONE-acetaminophen 5-325 MG per tablet          She is to continue Vitamin D therapy as well. Continue baby ASA 81 mg po q day.  Condition: stable Instructions: refer to practice specific booklet Discharge to: home Follow-up Information    Follow up with [facility] in 5 weeks. (Follow-up with Dr. Cyndie Chime per usual routine for heparin monitoring.  )          Newborn Data: Live born  Information for the patient's newborn:  Kaestner, Marguetta Windish  [[id number]]  female ; APGAR 8/10, ; weight 6+10 ;  Home with mother.  Nigel Bridgeman 08/07/2011, 8:26 AM

## 2011-08-15 ENCOUNTER — Encounter (HOSPITAL_COMMUNITY): Payer: Self-pay

## 2011-08-15 ENCOUNTER — Inpatient Hospital Stay (HOSPITAL_COMMUNITY)
Admission: AD | Admit: 2011-08-15 | Discharge: 2011-08-15 | Disposition: A | Discharge status: Home / Self Care | Payer: PRIVATE HEALTH INSURANCE | Source: Ambulatory Visit | Attending: Obstetrics and Gynecology | Admitting: Obstetrics and Gynecology

## 2011-08-15 DIAGNOSIS — Z86718 Personal history of other venous thrombosis and embolism: Secondary | ICD-10-CM | POA: Insufficient documentation

## 2011-08-15 DIAGNOSIS — O99893 Other specified diseases and conditions complicating puerperium: Secondary | ICD-10-CM | POA: Insufficient documentation

## 2011-08-15 DIAGNOSIS — M329 Systemic lupus erythematosus, unspecified: Secondary | ICD-10-CM | POA: Insufficient documentation

## 2011-08-15 DIAGNOSIS — Z79899 Other long term (current) drug therapy: Secondary | ICD-10-CM | POA: Insufficient documentation

## 2011-08-15 DIAGNOSIS — T45515A Adverse effect of anticoagulants, initial encounter: Secondary | ICD-10-CM | POA: Insufficient documentation

## 2011-08-15 DIAGNOSIS — O9081 Anemia of the puerperium: Secondary | ICD-10-CM | POA: Insufficient documentation

## 2011-08-15 DIAGNOSIS — O909 Complication of the puerperium, unspecified: Secondary | ICD-10-CM

## 2011-08-15 DIAGNOSIS — D649 Anemia, unspecified: Secondary | ICD-10-CM | POA: Insufficient documentation

## 2011-08-15 LAB — APTT: aPTT: 32 seconds (ref 24–37)

## 2011-08-15 LAB — CBC
HCT: 31.6 % — ABNORMAL LOW (ref 36.0–46.0)
MCHC: 34.2 g/dL (ref 30.0–36.0)
RDW: 13 % (ref 11.5–15.5)

## 2011-08-15 LAB — DIFFERENTIAL
Basophils Absolute: 0 10*3/uL (ref 0.0–0.1)
Basophils Relative: 0 % (ref 0–1)
Monocytes Absolute: 0.7 10*3/uL (ref 0.1–1.0)
Neutro Abs: 8.8 10*3/uL — ABNORMAL HIGH (ref 1.7–7.7)
Neutrophils Relative %: 77 % (ref 43–77)

## 2011-08-15 LAB — PROTIME-INR
INR: 0.97 (ref 0.00–1.49)
Prothrombin Time: 13.1 seconds (ref 11.6–15.2)

## 2011-08-15 MED ORDER — METHYLERGONOVINE MALEATE 0.2 MG PO TABS: 0.2000 mg | ORAL_TABLET | Freq: Three times a day (TID) | ORAL | Status: DC

## 2011-08-15 MED ORDER — OXYCODONE-ACETAMINOPHEN 5-325 MG PO TABS: 1.0000 | ORAL_TABLET | ORAL | Status: AC | PRN

## 2011-08-15 NOTE — ED Notes (Signed)
Pt has lupus and is postpartum; she delivered vaginally on 08-05-11; G1; breastffeeding; pt is pale and feels very tired; bleeding had slowed down but had not completely stopped; today around 1000- bleeding became heavy with large clots; denies a lot of cramping but c/o pressure and soreness in the vagina;

## 2011-08-15 NOTE — Progress Notes (Signed)
History   states "bleeding this am, size tennis ball and soaking pads x 3, less since arrival, delivery on 1/9 with no heavy bleeding prior, on heparin per Dr. Cyndie Chime per her request as she had some left at home, didd not take am dose of heparin today, plan will go to Lovenox 40 two times daily  Has RX" breastfeeding. Meds: heparin 10,000 sq BID  Chief Complaint  Patient presents with  . Vaginal Bleeding    OB History    Grav Para Term Preterm Abortions TAB SAB Ect Mult Living   1 1 1  0 0 0 0 0 0 1      Past Medical History  Diagnosis Date  . Lupus (systemic lupus erythematosus) 10/2006  . DVT (deep venous thrombosis)   . Anemia   . Lupus   . Supervision of high-risk pregnancy 07/14/2011    Past Surgical History  Procedure Date  . Appendectomy   . Cardiac catheterization 04/2010    To close PFO    No family history on file.  History  Substance Use Topics  . Smoking status: Never Smoker   . Smokeless tobacco: Never Used  . Alcohol Use: No    Allergies:  Allergies  Allergen Reactions  . Penicillins Rash    childhood reaction    Prescriptions prior to admission  Medication Sig Dispense Refill  . acetaminophen (TYLENOL) 500 MG tablet Take 500 mg by mouth as needed. For pain       . calcium carbonate (OS-CAL) 600 MG TABS Take 600 mg by mouth 2 (two) times daily with a meal.        . heparin 16109 UNIT/ML injection Inject 10,000 Units into the skin 2 (two) times daily.       . hydroxychloroquine (PLAQUENIL) 200 MG tablet Take 200 mg by mouth daily.        Marland Kitchen oxyCODONE-acetaminophen (PERCOCET) 5-325 MG per tablet Take 1-2 tablets by mouth every 3 (three) hours as needed (moderate - severe pain).  30 tablet  0  . Prenatal Vit-Fe Fumarate-FA (PRENATAL MULTIVITAMIN) TABS Take 1 tablet by mouth daily.       Physical Exam  Calm quiet, no obvious distress, lungs clear bilaterally, AP regular, abd soft, nt, bowel sounds active, uterus feels 13 week size, SSE small amount  pooling red blood clot with no additional bleeding seen from os No edema lower extermities Blood pressure 94/61, pulse 71, temperature 97.1 F (36.2 C), temperature source Oral, resp. rate 16, height 5\' 2"  (1.575 m), weight 127 lb (57.607 kg), last menstrual period 11/07/2010, SpO2 99.00%, unknown if currently breastfeeding.  Results for orders placed during the hospital encounter of 08/15/11 (from the past 72 hour(s))  PROTIME-INR     Status: Normal   Collection Time   08/15/11  2:17 PM      Component Value Range Comment   Prothrombin Time 13.1  11.6 - 15.2 (seconds)    INR 0.97  0.00 - 1.49    APTT     Status: Normal   Collection Time   08/15/11  2:17 PM      Component Value Range Comment   aPTT 32  24 - 37 (seconds)   CBC     Status: Abnormal   Collection Time   08/15/11  2:17 PM      Component Value Range Comment   WBC 11.4 (*) 4.0 - 10.5 (K/uL)    RBC 3.29 (*) 3.87 - 5.11 (MIL/uL)    Hemoglobin 10.8 (*)  12.0 - 15.0 (g/dL)    HCT 16.1 (*) 09.6 - 46.0 (%)    MCV 96.0  78.0 - 100.0 (fL)    MCH 32.8  26.0 - 34.0 (pg)    MCHC 34.2  30.0 - 36.0 (g/dL)    RDW 04.5  40.9 - 81.1 (%)    Platelets 277  150 - 400 (K/uL)   DIFFERENTIAL     Status: Abnormal   Collection Time   08/15/11  2:17 PM      Component Value Range Comment   Neutrophils Relative 77  43 - 77 (%)    Neutro Abs 8.8 (*) 1.7 - 7.7 (K/uL)    Lymphocytes Relative 16  12 - 46 (%)    Lymphs Abs 1.8  0.7 - 4.0 (K/uL)    Monocytes Relative 6  3 - 12 (%)    Monocytes Absolute 0.7  0.1 - 1.0 (K/uL)    Eosinophils Relative 1  0 - 5 (%)    Eosinophils Absolute 0.1  0.0 - 0.7 (K/uL)    Basophils Relative 0  0 - 1 (%)    Basophils Absolute 0.0  0.0 - 0.1 (K/uL)    ED Course  Pp day 8 Bleeding Hx of DTV on heparin Plan labs per Dr. Estanislado Pandy at [facility] Lavera Guise, CNM Late entry from 1430  MDM

## 2011-08-15 NOTE — ED Notes (Signed)
S: bleeding has returned to normal  O: labs reviewed with unchanged Hgb since discharge  Results for Katherine Harrington, KEEVEN (MRN [id number]) as of 08/15/2011 15:25  Ref. Range 08/15/2011 14:17  WBC Latest Range: 4.0-10.5 K/uL 11.4 (H)  RBC Latest Range: 3.87-5.11 MIL/uL 3.29 (L)  HGB Latest Range: 12.0-15.0 g/dL 78.2 (L)  HCT Latest Range: 36.0-46.0 % 31.6 (L)  MCV Latest Range: 78.0-100.0 fL 96.0  MCH Latest Range: 26.0-34.0 pg 32.8  MCHC Latest Range: 30.0-36.0 g/dL 95.6  RDW Latest Range: 11.5-15.5 % 13.0  Platelets Latest Range: 150-400 K/uL 277  Neutrophils Relative Latest Range: 43-77 % 77  Lymphocytes Relative Latest Range: 12-46 % 16  Monocytes Relative Latest Range: 3-12 % 6  Eosinophils Relative Latest Range: 0-5 % 1  Basophils Relative Latest Range: 0-1 % 0  Neutrophils Absolute Latest Range: 1.7-7.7 K/uL 8.8 (H)  Lymphocytes Absolute Latest Range: 0.7-4.0 K/uL 1.8  Monocytes Absolute Latest Range: 0.1-1.0 K/uL 0.7  Eosinophils Absolute Latest Range: 0.0-0.5 10e3/uL 0.1  Basophils Absolute Latest Range: 0.0-0.1 K/uL 0.0  Prothrombin Time Latest Range: 11.6-15.2 seconds 13.1  INR Latest Range: 0.00-1.49  0.97  aPTT Latest Range: 24-37 seconds 32    A: post-partum bleeding worsened by anticoagulation, now improved  P: D/C home      Methergine TID for 2 days      Keep office appointment as scheduled.

## 2011-08-15 NOTE — Progress Notes (Signed)
Patient states she had a SVD on 1-8. Started bleeding heavily this am and passing tennis ball size clots. Feels week but not dizzy or lightheaded.

## 2011-10-08 ENCOUNTER — Encounter (INDEPENDENT_AMBULATORY_CARE_PROVIDER_SITE_OTHER): Payer: PRIVATE HEALTH INSURANCE | Admitting: Registered Nurse

## 2011-10-08 DIAGNOSIS — Z3049 Encounter for surveillance of other contraceptives: Secondary | ICD-10-CM

## 2011-10-08 DIAGNOSIS — Z30430 Encounter for insertion of intrauterine contraceptive device: Secondary | ICD-10-CM

## 2011-11-04 ENCOUNTER — Encounter: Payer: Self-pay | Admitting: Obstetrics and Gynecology

## 2011-11-04 ENCOUNTER — Ambulatory Visit (INDEPENDENT_AMBULATORY_CARE_PROVIDER_SITE_OTHER): Payer: PRIVATE HEALTH INSURANCE | Admitting: Obstetrics and Gynecology

## 2011-11-04 VITALS — BP 94/62 | HR 70 | Resp 16 | Wt 122.0 lb

## 2011-11-04 DIAGNOSIS — Z30431 Encounter for routine checking of intrauterine contraceptive device: Secondary | ICD-10-CM

## 2011-11-04 DIAGNOSIS — Q211 Atrial septal defect: Secondary | ICD-10-CM | POA: Insufficient documentation

## 2011-11-04 NOTE — Progress Notes (Signed)
IUD f/u Visit No complaints  Exam String visible Ut NT, Adnexa NT, no palpable adnexal masses Ext Gent wnl  F/u for AEX 09/2012

## 2012-01-12 ENCOUNTER — Ambulatory Visit: Payer: PRIVATE HEALTH INSURANCE | Admitting: Oncology

## 2012-01-13 ENCOUNTER — Encounter: Payer: Self-pay | Admitting: Oncology

## 2012-01-13 NOTE — Progress Notes (Signed)
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 She was subsequently diagnosed with systemic lupus. She has tested variably positive for the lupus type anticoagulant. She was recently  pregnant and was kept on prophylactic anticoagulation with Lovenox and then unfractionated heparin near term and delivered a healthy infant without any complications on 08/04/2011. I saw her in the hospital and she did well. She did not keep her appointment today. I will see her again in the future on an as-needed basis.

## 2013-03-22 ENCOUNTER — Encounter (HOSPITAL_COMMUNITY): Payer: Self-pay

## 2013-03-22 ENCOUNTER — Ambulatory Visit (HOSPITAL_COMMUNITY)
Admission: RE | Admit: 2013-03-22 | Discharge: 2013-03-22 | Disposition: A | Discharge status: Home / Self Care | Payer: PRIVATE HEALTH INSURANCE | Source: Ambulatory Visit | Attending: Obstetrics and Gynecology | Admitting: Obstetrics and Gynecology

## 2013-03-22 VITALS — BP 114/66 | HR 92

## 2013-03-22 DIAGNOSIS — M329 Systemic lupus erythematosus, unspecified: Secondary | ICD-10-CM

## 2013-03-22 NOTE — Progress Notes (Signed)
MATERNAL FETAL MEDICINE CONSULT  Patient Name: Katherine Harrington Medical Record Number:  [id number] Date of Birth: 1980/08/19 Requesting Physician Name:  Katherine Arthur, MD Date of Service: 03/22/2013  Chief Complaint Systemic lupus erythematosus in pregnancy  History of Present Illness Katherine Harrington was seen today secondary to systemic lupus erythematosus in pregnancy at the request of Katherine Arthur, MD.  The patient is a 32 y.o. G2P1001,at [redacted]w[redacted]d with an EDD of 11/15/2013, by Last Menstrual Period dating method.  She suffered bilateral lower extremities DVT's in 2007 while on NuvaRing for contraception.  She was subsequently diagnosed with systemic lupus erythematosus and has been under the care of Dr. Kellie Harrington in Rheumatology since 2009.  She has had positive anti-B2 microglobulin and lupus anticoagulant in th past, but her most recent anti-phospholipid syndrome testing at the time of her last pregnancy in 2012 was negative.  She was treated with prophylactic enoxaparin and heparin in her last pregnancy which ended in an uncomplicated delivery at term.  She was also found to be SS/A and SS/B positive in that pregnancy and was followed with serial echoes.  Fetal heart block did not develop.  She has been asymptomatic from an SLE perspective for several years and is currently taking Plaquenil 200 mg po daily.  She reports some mild nausea and vomiting at this time, but no SLE symptoms or other issues thus far in pregnancy.  She is very familiar with the increased testing required in pregnancies complicated by SLE due to her experiences in her last pregnancy.  Review of Systems Pertinent items are noted in HPI.  Patient History OB History  Gravida Para Term Preterm AB SAB TAB Ectopic Multiple Living  2 1 1  0 0 0 0 0 0 1    # Outcome Date GA Lbr Len/2nd Weight Sex Delivery Anes PTL Lv  2 CUR           1 TRM 08/05/11 [redacted]w[redacted]d 07:08 / 00:51 6 lb 10.3 oz (3.014 kg) M SVD EPI  Y      Past Medical  History  Diagnosis Date  . Lupus (systemic lupus erythematosus) 10/2006  . DVT (deep venous thrombosis)   . Anemia   . Lupus   . Supervision of high-risk pregnancy 07/14/2011  . Patent foramen ovale with atrial septal aneurysm 2011     s/p closure with helix device 04/30/2010 (cardiac cath)    Past Surgical History  Procedure Laterality Date  . Appendectomy    . Cardiac catheterization  04/2010    To close PFO    History   Social History  . Marital Status: Married    Spouse Name: N/A    Number of Children: N/A  . Years of Education: N/A   Social History Main Topics  . Smoking status: Never Smoker   . Smokeless tobacco: Never Used  . Alcohol Use: No  . Drug Use: No  . Sexual Activity: Yes   Other Topics Concern  . Not on file   Social History Narrative  . No narrative on file    Family History Katherine Harrington has no family history of mental retardation, birth defects, or genetic diseases.  Physical Examination Filed Vitals:   03/22/13 1408  BP: 114/66  Pulse: 92   General appearance - alert, well appearing, and in no distress  Assessment and Recommendations 1.  Systemic lupus erythematosus in pregnancy.  Fortunately, Katherine Harrington lupus appears to be under good control at this time.  She should  continue Plaquenil throughout pregnancy as their are minimal fetal risks associated with this medication and they are clearly outweighed by its benefits in preventing maternal symptoms.  Although her most recent antiphospholipid testing has been negative, she does have a history of a positive anti-Beta2 microglobulin antibodies and lupus anticoagulant.  Thus, I recommend repeating a set of anti-cardiolipin antibodies, anti-Beta2 microglobulin antibodies, and lupus anticoagulant at this time.  If they are positive she should be treated with therapeutic, rather than prophylactic anticoagulation.  Lupus and other autoimmune vasculitides can lead to nephritis and hypertension which can  arise for the first time or become exacerbated in pregnancy.  To establish a baseline for the patient a CBC, ANA, anti-dsDNA, anti-Smith, C3 complement, and C4 complement should be ordered.  In addition, a 24 hour urine collection and serum creatinine should be performed to determine her baseline creatinine clearance and proteinuria.  These labs should be repeated each trimester and as clinically indicated based on disease symptoms or the appearance of hypertension.  The presence of SS/A or SS/B atnibodies carries approximately a 5% risk of congenital heart block.  As Katherine Harrington has been positive for these antibodies in the past she should be seen once a week after 23 weeks to assess for fetal bradycardia or other arrhythmia via bedside doppler and if present an obstetrical ultrasound should be obtained.  She will also need a fetal echocardiogram performed at approximately 23 weeks as well.  Finally, as autoimmune diseases are associated with fetal growth restriction the patient should have serial growth scans every 4-6 weeks after her anatomy scan at approximately 18 weeks.  Once or twice weekly fetal surveillance should be started at 32 weeks or sooner if fetal growth restriction is found or if congenital heart block develops. 2.  History of DVT.  Provided Katherine Harrington antiphospholipid testing discussed above comes back negative she should be treated with prophylactic low-molecular weight or unfractionated heparin.  If her antiphospholipid testing is positive she will required therapeutic anticoagulation.  Thank you for referring [facility] to [facility].  Please do not hesitate to contact us if you have any questions.  I spent 15 minutes with Katherine Harrington today of which 50% was face-to-face counseling.  Rema Fendt, MD

## 2013-04-12 LAB — OB RESULTS CONSOLE HGB/HCT, BLOOD
HEMATOCRIT: 37 %
HEMOGLOBIN: 13 g/dL

## 2013-04-12 LAB — OB RESULTS CONSOLE RUBELLA ANTIBODY, IGM: Rubella: IMMUNE

## 2013-04-12 LAB — OB RESULTS CONSOLE RPR: RPR: REACTIVE

## 2013-04-12 LAB — OB RESULTS CONSOLE GC/CHLAMYDIA
CHLAMYDIA, DNA PROBE: NEGATIVE
Gonorrhea: NEGATIVE

## 2013-04-12 LAB — OB RESULTS CONSOLE ABO/RH: RH Type: POSITIVE

## 2013-04-12 LAB — OB RESULTS CONSOLE HIV ANTIBODY (ROUTINE TESTING): HIV: NONREACTIVE

## 2013-04-12 LAB — OB RESULTS CONSOLE ANTIBODY SCREEN: Antibody Screen: NEGATIVE

## 2013-07-28 NOTE — L&D Delivery Note (Signed)
1425: Patient noted to be having variable decelerations after epidural placement.  Patient reporting right side discomfort and increase in rectal pressure.  Patient checked and exam 10/100/+1.  Room prepped for delivery. Patient began pushing shortly after and delivered as below.   Delivery Note At 3:23 PM a viable female "Katherine Harrington" was delivered via Vaginal, Spontaneous Delivery (Presentation: Left Occiput Anterior).  APGAR: 9, 9; weight .   Placenta status: Intact, Spontaneous.  Nuchal Cord x1 that infant was delivered through: 3 vessels with the following complications: None.  Cord pH: N/A  Anesthesia: Epidural  Episiotomy: None Lacerations: None Suture Repair: N/A Est. Blood Loss (mL): 200  Mom to postpartum.  Baby to Couplet care / Skin to Skin.  Mom to start Lovenox prophylaxis   Marlene BastJessica Lynn Ivey Nembhard, CNM, MSN 11/08/2013, 3:58 PM

## 2013-08-11 LAB — OB RESULTS CONSOLE RPR: RPR: NONREACTIVE

## 2013-09-28 ENCOUNTER — Inpatient Hospital Stay (HOSPITAL_COMMUNITY): Payer: PRIVATE HEALTH INSURANCE

## 2013-09-28 ENCOUNTER — Encounter (HOSPITAL_COMMUNITY): Payer: Self-pay | Admitting: *Deleted

## 2013-09-28 ENCOUNTER — Inpatient Hospital Stay (HOSPITAL_COMMUNITY)
Admission: AD | Admit: 2013-09-28 | Discharge: 2013-09-28 | Disposition: A | Discharge status: Home / Self Care | Payer: PRIVATE HEALTH INSURANCE | Source: Ambulatory Visit | Attending: Obstetrics and Gynecology | Admitting: Obstetrics and Gynecology

## 2013-09-28 DIAGNOSIS — M329 Systemic lupus erythematosus, unspecified: Secondary | ICD-10-CM | POA: Insufficient documentation

## 2013-09-28 DIAGNOSIS — IMO0002 Reserved for concepts with insufficient information to code with codable children: Secondary | ICD-10-CM | POA: Insufficient documentation

## 2013-09-28 DIAGNOSIS — O9989 Other specified diseases and conditions complicating pregnancy, childbirth and the puerperium: Principal | ICD-10-CM

## 2013-09-28 DIAGNOSIS — O99891 Other specified diseases and conditions complicating pregnancy: Secondary | ICD-10-CM | POA: Insufficient documentation

## 2013-09-28 DIAGNOSIS — Z86718 Personal history of other venous thrombosis and embolism: Secondary | ICD-10-CM | POA: Insufficient documentation

## 2013-09-28 LAB — CBC
HEMATOCRIT: 32.9 % — AB (ref 36.0–46.0)
HEMOGLOBIN: 11.5 g/dL — AB (ref 12.0–15.0)
MCH: 32.5 pg (ref 26.0–34.0)
MCHC: 35 g/dL (ref 30.0–36.0)
MCV: 92.9 fL (ref 78.0–100.0)
Platelets: 134 10*3/uL — ABNORMAL LOW (ref 150–400)
RBC: 3.54 MIL/uL — ABNORMAL LOW (ref 3.87–5.11)
RDW: 12.6 % (ref 11.5–15.5)
WBC: 8.7 10*3/uL (ref 4.0–10.5)

## 2013-09-28 LAB — CREATININE, SERUM
Creatinine, Ser: 0.53 mg/dL (ref 0.50–1.10)
GFR calc non Af Amer: >90 mL/min (ref >90)

## 2013-09-28 LAB — TSH: TSH: 2.726 u[IU]/mL (ref 0.350–4.500)

## 2013-09-28 MED ORDER — LACTATED RINGERS IV SOLN: INTRAVENOUS | Status: DC

## 2013-09-28 NOTE — MAU Note (Signed)
Sent from office for monitoring, low score on BPP.

## 2013-09-28 NOTE — Discharge Instructions (Signed)
Lupus Lupus (also called systemic lupus erythematosus, SLE) is a disorder of the body's natural defense system (immune system). In lupus, the immune system attacks various areas of the body (autoimmune disease). CAUSES The cause is unknown. However, lupus runs in families. Certain genes can make you more likely to develop lupus. It is 10 times more common in women than in men. Lupus is also more common in African Americans and Asians. Other factors also play a role, such as viruses (Epstein-Barr virus, EBV), stress, hormones, cigarette smoke, and certain drugs. SYMPTOMS Lupus can affect many parts of the body, including the joints, skin, kidneys, lungs, heart, nervous system, and blood vessels. The signs and symptoms of lupus differ from person to person. The disease can range from mild to life-threatening. Typical features of lupus include:  Butterfly-shaped rash over the face.  Arthritis involving one or more joints.  Kidney disease.  Fever, weight loss, hair loss, fatigue.  Poor circulation in the fingers and toes (Raynaud's disease).  Chest pain when taking deep breaths. Abdominal pain may also occur.  Skin rash in areas exposed to the sun.  Sores in the mouth and nose. DIAGNOSIS Diagnosing lupus can take a long time and is often difficult. An exam and an accurate account of your symptoms and health problems is very important. Blood tests are necessary, though no single test can confirm or rule out lupus. Most people with lupus test positive for antinuclear antibodies (ANA) on a blood test. Additional blood tests, a urine test (urinalysis), and sometimes a kidney or skin tissue sample (biopsy) can help to confirm or rule out lupus. TREATMENT There is no cure for lupus. Your caregiver will develop a treatment plan based on your age, sex, health, symptoms, and lifestyle. The goals are to prevent flares, to treat them when they do occur, and to minimize organ damage and complications. How  the disease may affect each person varies widely. Most people with lupus can live normal lives, but this disorder must be carefully monitored. Treatment must be adjusted as necessary to prevent serious complications. Medicines used for treatment:  Nonsteroidal anti-inflammatory drugs (NSAIDs) decrease inflammation and can help with chest pain, joint pain, and fevers. Examples include ibuprofen and naproxen.  Antimalarial drugs were designed to treat malaria. They also treat fatigue, joint pain, skin rashes, and inflammation of the lungs in patients with lupus.  Corticosteroids are powerful hormones that rapidly suppress inflammation. The lowest dose with the highest benefit will be chosen. They can be given by cream, pills, injections, and through the vein (intravenously).  Immunosuppressive drugs block the making of immune cells. They may be used for kidney or nerve disease. HOME CARE INSTRUCTIONS  Exercise. Low-impact activities can usually help keep joints flexible without being too strenuous.  Rest after periods of exercise.  Avoid excessive sun exposure.  Follow proper nutrition and take supplements as recommended by your caregiver.  Stress management can be helpful. SEEK MEDICAL CARE IF:  You have increased fatigue.  You develop pain.  You develop a rash.  You have an oral temperature above 102 F (38.9 C).  You develop abdominal discomfort.  You develop a headache.  You experience dizziness. FOR MORE INFORMATION National Institute of Neurological Disorders and Stroke: www.ninds.nih.gov American College of Rheumatology: www.rheumatology.org National Institute of Arthritis and Musculoskeletal and Skin Diseases: www.niams.nih.gov Document Released: 07/04/2002 Document Revised: 10/06/2011 Document Reviewed: 10/25/2009 Exit[employer] Patient Information 2014 [employer].  

## 2013-09-28 NOTE — MAU Note (Signed)
Katherine Harrington is a 33 y.o. female sent from the office at 33+ 1 weeks for further evaluation [facility]8 in office: no movement and no breathing. Normal AFI. EFW 4 lbs 13 oz  64% Patient reports good fetal movement. Denies contractions, bleeding or LOF. Complains of increased fatigue.  Pregnancy remarkable for: 1. Lupus well-controlled with Plaquenil 2. Previous DVT with negative thrombophilia panel during this pregnancy: on Lovenox prophylactically 3. Positive SSa and SSb with normal echocardiogram and no heart block 4. S/P closure of patent foramen ovale  History OB History   Grav Para Term Preterm Abortions TAB SAB Ect Mult Living   2 1 1  0 0 0 0 0 0 1     Past Medical History  Diagnosis Date  . Lupus (systemic lupus erythematosus) 10/2006  . DVT (deep venous thrombosis)   . Anemia   . Lupus   . Supervision of high-risk pregnancy 07/14/2011  . Patent foramen ovale with atrial septal aneurysm 2011     s/p closure with helix device 04/30/2010 (cardiac cath)   Past Surgical History  Procedure Laterality Date  . Appendectomy    . Cardiac catheterization  04/2010    To close PFO      Blood pressure 119/72, pulse 109, temperature 97.9 F (36.6 C), temperature source Oral, resp. rate 18, height 5\' 1"  (1.549 m), weight 138 lb (62.596 kg), last menstrual period 02/08/2013.  General Appearance: Alert, appropriate appearance for age. No acute distress HEENT Exam: Grossly normal Psychiatric Exam: Alert and oriented, appropriate affect  Fetal monitoring: Category 1 for the last 1 hour  and reviewed and reassuring  ++++++++++++++++++++++++++++++++++++++++++++++++++++++++++++++++  Vaginal exam: deferred   ++++++++++++++++++++++++++++++++++++++++++++++++++++++++++++++++  Assessment and plan:  33+1 weeks with BPP now 6/10.  Will repeat BPP and if normal may discharge homw and follow-up next week in the office\ Labs repeated today due to increased fatigue  Next appointment  in office: 1 week  Silverio LaySandra Cassady Stanczak MD

## 2013-09-28 NOTE — MAU Note (Signed)
Sent from office for monitoring and labwork;

## 2013-09-28 NOTE — MAU Note (Addendum)
Wkly BPP due to lupus. Was 4/8

## 2013-09-29 LAB — LUPUS ANTICOAGULANT PANEL
DRVVT: 40.6 s (ref <42.9)
LUPUS ANTICOAGULANT: NOT DETECTED
PTT Lupus Anticoagulant: 32.3 secs (ref 28.0–43.0)

## 2013-09-29 LAB — C4 COMPLEMENT: COMPLEMENT C4, BODY FLUID: 11 mg/dL — AB (ref 10–40)

## 2013-09-29 LAB — C3 COMPLEMENT: C3 COMPLEMENT: 98 mg/dL (ref 90–180)

## 2013-09-29 LAB — ANTI-DNA ANTIBODY, DOUBLE-STRANDED: DS DNA AB: 6 [IU]/mL — AB

## 2013-10-20 LAB — OB RESULTS CONSOLE GBS: STREP GROUP B AG: NEGATIVE

## 2013-11-08 ENCOUNTER — Inpatient Hospital Stay (HOSPITAL_COMMUNITY)
Admission: AD | Admit: 2013-11-08 | Discharge: 2013-11-10 | DRG: 775 | Disposition: A | Discharge status: Home / Self Care | Payer: PRIVATE HEALTH INSURANCE | Source: Ambulatory Visit | Attending: Obstetrics and Gynecology | Admitting: Obstetrics and Gynecology

## 2013-11-08 ENCOUNTER — Encounter (HOSPITAL_COMMUNITY): Payer: Self-pay | Admitting: *Deleted

## 2013-11-08 ENCOUNTER — Encounter (HOSPITAL_COMMUNITY): Payer: PRIVATE HEALTH INSURANCE | Admitting: Anesthesiology

## 2013-11-08 ENCOUNTER — Inpatient Hospital Stay (HOSPITAL_COMMUNITY): Payer: PRIVATE HEALTH INSURANCE | Admitting: Anesthesiology

## 2013-11-08 DIAGNOSIS — Z86718 Personal history of other venous thrombosis and embolism: Secondary | ICD-10-CM

## 2013-11-08 DIAGNOSIS — Z8774 Personal history of (corrected) congenital malformations of heart and circulatory system: Secondary | ICD-10-CM

## 2013-11-08 DIAGNOSIS — O2220 Superficial thrombophlebitis in pregnancy, unspecified trimester: Secondary | ICD-10-CM

## 2013-11-08 DIAGNOSIS — O9902 Anemia complicating childbirth: Secondary | ICD-10-CM | POA: Diagnosis present

## 2013-11-08 DIAGNOSIS — D649 Anemia, unspecified: Secondary | ICD-10-CM | POA: Diagnosis present

## 2013-11-08 DIAGNOSIS — Z880 Allergy status to penicillin: Secondary | ICD-10-CM

## 2013-11-08 DIAGNOSIS — I809 Phlebitis and thrombophlebitis of unspecified site: Secondary | ICD-10-CM | POA: Diagnosis present

## 2013-11-08 DIAGNOSIS — O0990 Supervision of high risk pregnancy, unspecified, unspecified trimester: Secondary | ICD-10-CM

## 2013-11-08 DIAGNOSIS — O9989 Other specified diseases and conditions complicating pregnancy, childbirth and the puerperium: Principal | ICD-10-CM

## 2013-11-08 DIAGNOSIS — M329 Systemic lupus erythematosus, unspecified: Secondary | ICD-10-CM | POA: Diagnosis present

## 2013-11-08 DIAGNOSIS — O99892 Other specified diseases and conditions complicating childbirth: Principal | ICD-10-CM | POA: Diagnosis present

## 2013-11-08 LAB — CBC
HEMATOCRIT: 35.7 % — AB (ref 36.0–46.0)
HEMOGLOBIN: 12.3 g/dL (ref 12.0–15.0)
MCH: 31.9 pg (ref 26.0–34.0)
MCHC: 34.5 g/dL (ref 30.0–36.0)
MCV: 92.5 fL (ref 78.0–100.0)
Platelets: 157 10*3/uL (ref 150–400)
RBC: 3.86 MIL/uL — AB (ref 3.87–5.11)
RDW: 12.3 % (ref 11.5–15.5)
WBC: 7.5 10*3/uL (ref 4.0–10.5)

## 2013-11-08 LAB — RPR: RPR: REACTIVE — AB

## 2013-11-08 LAB — TYPE AND SCREEN
ABO/RH(D): A POS
Antibody Screen: NEGATIVE

## 2013-11-08 LAB — ABO/RH: ABO/RH(D): A POS

## 2013-11-08 LAB — RPR TITER: RPR Titer: 1:1 {titer}

## 2013-11-08 MED ORDER — TERBUTALINE SULFATE 1 MG/ML IJ SOLN: 0.2500 mg | Freq: Once | INTRAMUSCULAR | Status: AC | PRN

## 2013-11-08 MED ORDER — LIDOCAINE HCL (PF) 1 % IJ SOLN
INTRAMUSCULAR | Status: DC | PRN
  Administered 2013-11-08 (×2): 5 mL

## 2013-11-08 MED ORDER — PRENATAL MULTIVITAMIN CH
1.0000 | ORAL_TABLET | Freq: Every day | ORAL | Status: DC
  Administered 2013-11-09 – 2013-11-10 (×2): 1 via ORAL
  Filled 2013-11-08 (×2): qty 1

## 2013-11-08 MED ORDER — FENTANYL 2.5 MCG/ML BUPIVACAINE 1/10 % EPIDURAL INFUSION (WH - ANES)
INTRAMUSCULAR | Status: AC
  Filled 2013-11-08: qty 125

## 2013-11-08 MED ORDER — LIDOCAINE HCL (PF) 1 % IJ SOLN
30.0000 mL | INTRAMUSCULAR | Status: DC | PRN
  Filled 2013-11-08: qty 30

## 2013-11-08 MED ORDER — EPHEDRINE 5 MG/ML INJ
10.0000 mg | INTRAVENOUS | Status: DC | PRN
  Filled 2013-11-08: qty 2

## 2013-11-08 MED ORDER — PHENYLEPHRINE 40 MCG/ML (10ML) SYRINGE FOR IV PUSH (FOR BLOOD PRESSURE SUPPORT)
80.0000 ug | PREFILLED_SYRINGE | INTRAVENOUS | Status: DC | PRN
  Filled 2013-11-08: qty 2

## 2013-11-08 MED ORDER — DIPHENHYDRAMINE HCL 50 MG/ML IJ SOLN: 12.5000 mg | INTRAMUSCULAR | Status: DC | PRN

## 2013-11-08 MED ORDER — HYDROXYCHLOROQUINE SULFATE 200 MG PO TABS
200.0000 mg | ORAL_TABLET | Freq: Every day | ORAL | Status: DC
  Administered 2013-11-08 – 2013-11-09 (×2): 200 mg via ORAL
  Filled 2013-11-08 (×2): qty 1

## 2013-11-08 MED ORDER — TETANUS-DIPHTH-ACELL PERTUSSIS 5-2.5-18.5 LF-MCG/0.5 IM SUSP: 0.5000 mL | Freq: Once | INTRAMUSCULAR | Status: DC

## 2013-11-08 MED ORDER — ACETAMINOPHEN 325 MG PO TABS
650.0000 mg | ORAL_TABLET | ORAL | Status: DC | PRN
  Administered 2013-11-08: 650 mg via ORAL
  Filled 2013-11-08: qty 2

## 2013-11-08 MED ORDER — CITRIC ACID-SODIUM CITRATE 334-500 MG/5ML PO SOLN: 30.0000 mL | ORAL | Status: DC | PRN

## 2013-11-08 MED ORDER — ONDANSETRON HCL 4 MG/2ML IJ SOLN: 4.0000 mg | INTRAMUSCULAR | Status: DC | PRN

## 2013-11-08 MED ORDER — OXYTOCIN BOLUS FROM INFUSION
500.0000 mL | INTRAVENOUS | Status: DC
  Administered 2013-11-08: 500 mL via INTRAVENOUS

## 2013-11-08 MED ORDER — IBUPROFEN 600 MG PO TABS
600.0000 mg | ORAL_TABLET | Freq: Four times a day (QID) | ORAL | Status: DC
  Administered 2013-11-09 – 2013-11-10 (×7): 600 mg via ORAL
  Filled 2013-11-08 (×7): qty 1

## 2013-11-08 MED ORDER — DIPHENHYDRAMINE HCL 25 MG PO CAPS: 25.0000 mg | ORAL_CAPSULE | Freq: Four times a day (QID) | ORAL | Status: DC | PRN

## 2013-11-08 MED ORDER — ZOLPIDEM TARTRATE 5 MG PO TABS: 5.0000 mg | ORAL_TABLET | Freq: Every evening | ORAL | Status: DC | PRN

## 2013-11-08 MED ORDER — SIMETHICONE 80 MG PO CHEW: 80.0000 mg | CHEWABLE_TABLET | ORAL | Status: DC | PRN

## 2013-11-08 MED ORDER — DIBUCAINE 1 % RE OINT
1.0000 "application " | TOPICAL_OINTMENT | RECTAL | Status: DC | PRN
  Filled 2013-11-08: qty 28

## 2013-11-08 MED ORDER — OXYTOCIN 40 UNITS IN LACTATED RINGERS INFUSION - SIMPLE MED: 62.5000 mL/h | INTRAVENOUS | Status: DC

## 2013-11-08 MED ORDER — OXYCODONE-ACETAMINOPHEN 5-325 MG PO TABS: 1.0000 | ORAL_TABLET | ORAL | Status: DC | PRN

## 2013-11-08 MED ORDER — ENOXAPARIN SODIUM 40 MG/0.4ML ~~LOC~~ SOLN
40.0000 mg | SUBCUTANEOUS | Status: DC
  Administered 2013-11-09 – 2013-11-10 (×2): 40 mg via SUBCUTANEOUS
  Filled 2013-11-08 (×2): qty 0.4

## 2013-11-08 MED ORDER — WITCH HAZEL-GLYCERIN EX PADS: 1.0000 "application " | MEDICATED_PAD | CUTANEOUS | Status: DC | PRN

## 2013-11-08 MED ORDER — LACTATED RINGERS IV SOLN: 500.0000 mL | INTRAVENOUS | Status: DC | PRN

## 2013-11-08 MED ORDER — LACTATED RINGERS IV SOLN
INTRAVENOUS | Status: DC
  Administered 2013-11-08 (×2): via INTRAVENOUS

## 2013-11-08 MED ORDER — PHENYLEPHRINE 40 MCG/ML (10ML) SYRINGE FOR IV PUSH (FOR BLOOD PRESSURE SUPPORT)
PREFILLED_SYRINGE | INTRAVENOUS | Status: AC
Start: 2013-11-08 — End: 2013-11-09
  Filled 2013-11-08: qty 10

## 2013-11-08 MED ORDER — LANOLIN HYDROUS EX OINT: TOPICAL_OINTMENT | CUTANEOUS | Status: DC | PRN

## 2013-11-08 MED ORDER — ONDANSETRON HCL 4 MG PO TABS: 4.0000 mg | ORAL_TABLET | ORAL | Status: DC | PRN

## 2013-11-08 MED ORDER — IBUPROFEN 600 MG PO TABS: 600.0000 mg | ORAL_TABLET | Freq: Four times a day (QID) | ORAL | Status: DC | PRN

## 2013-11-08 MED ORDER — ONDANSETRON HCL 4 MG/2ML IJ SOLN: 4.0000 mg | Freq: Four times a day (QID) | INTRAMUSCULAR | Status: DC | PRN

## 2013-11-08 MED ORDER — BENZOCAINE-MENTHOL 20-0.5 % EX AERO
1.0000 "application " | INHALATION_SPRAY | CUTANEOUS | Status: DC | PRN
  Administered 2013-11-08: 1 via TOPICAL
  Filled 2013-11-08: qty 56

## 2013-11-08 MED ORDER — OXYTOCIN 40 UNITS IN LACTATED RINGERS INFUSION - SIMPLE MED
1.0000 m[IU]/min | INTRAVENOUS | Status: DC
  Administered 2013-11-08: 2 m[IU]/min via INTRAVENOUS
  Administered 2013-11-08: 6 m[IU]/min via INTRAVENOUS
  Administered 2013-11-08: 4 m[IU]/min via INTRAVENOUS
  Filled 2013-11-08: qty 1000

## 2013-11-08 MED ORDER — OXYCODONE-ACETAMINOPHEN 5-325 MG PO TABS
1.0000 | ORAL_TABLET | ORAL | Status: DC | PRN
Start: 2013-11-08 — End: 2013-11-10
  Administered 2013-11-09 – 2013-11-10 (×4): 1 via ORAL
  Filled 2013-11-08: qty 1
  Filled 2013-11-08: qty 2
  Filled 2013-11-08 (×3): qty 1

## 2013-11-08 MED ORDER — LACTATED RINGERS IV SOLN
500.0000 mL | Freq: Once | INTRAVENOUS | Status: AC
  Administered 2013-11-08: 500 mL via INTRAVENOUS

## 2013-11-08 MED ORDER — SENNOSIDES-DOCUSATE SODIUM 8.6-50 MG PO TABS
2.0000 | ORAL_TABLET | ORAL | Status: DC
Start: 2013-11-09 — End: 2013-11-10
  Administered 2013-11-09: 2 via ORAL
  Filled 2013-11-08 (×2): qty 2

## 2013-11-08 MED ORDER — EPHEDRINE 5 MG/ML INJ
INTRAVENOUS | Status: AC
Start: 2013-11-08 — End: 2013-11-09
  Filled 2013-11-08: qty 4

## 2013-11-08 MED ORDER — FENTANYL 2.5 MCG/ML BUPIVACAINE 1/10 % EPIDURAL INFUSION (WH - ANES)
14.0000 mL/h | INTRAMUSCULAR | Status: DC | PRN
  Administered 2013-11-08: 14 mL/h via EPIDURAL

## 2013-11-08 NOTE — Anesthesia Procedure Notes (Signed)
Epidural Patient location during procedure: OB Start time: 11/08/2013 1:57 PM  Staffing Anesthesiologist: Brayton CavesJACKSON, Ulah Olmo Performed by: anesthesiologist   Preanesthetic Checklist Completed: patient identified, site marked, surgical consent, pre-op evaluation, timeout performed, IV checked, risks and benefits discussed and monitors and equipment checked  Epidural Patient position: sitting Prep: site prepped and draped and DuraPrep Patient monitoring: continuous pulse ox and blood pressure Approach: midline Location: L3-L4 Injection technique: LOR air  Needle:  Needle type: Tuohy  Needle gauge: 17 G Needle length: 9 cm and 9 Needle insertion depth: 5 cm cm Catheter type: closed end flexible Catheter size: 19 Gauge Catheter at skin depth: 10 cm Test dose: negative  Assessment Events: blood not aspirated, injection not painful, no injection resistance, negative IV test and no paresthesia  Additional Notes Patient identified.  Risk benefits discussed including failed block, incomplete pain control, headache, nerve damage, paralysis, blood pressure changes, nausea, vomiting, reactions to medication both toxic or allergic, and postpartum back pain.  Patient expressed understanding and wished to proceed.  All questions were answered.  Sterile technique used throughout procedure and epidural site dressed with sterile barrier dressing. No paresthesia or other complications noted.The patient did not experience any signs of intravascular injection such as tinnitus or metallic taste in mouth nor signs of intrathecal spread such as rapid motor block. Please see nursing notes for vital signs.

## 2013-11-08 NOTE — Progress Notes (Signed)
Jessica Emly CNM in to see pt 

## 2013-11-08 NOTE — Lactation Note (Signed)
This note was copied from the chart of Katherine Carline Ruehl. Lactation Consultation Note  Patient Name: Katherine Harrington Today's Date: 11/08/2013 Reason for consult: Initial assessment Initial visit, baby 7 hours old. Attempted to latch baby, but baby too sleepy. Reviewed BF basics. Mom able to hand express colostrum. Enc mom to call out for assistance as needed. [facility], aware of OP and BFSG services, and community resources.   Maternal Data Infant to breast within first hour of birth: Yes Has patient been taught Hand Expression?: Yes Does the patient have breastfeeding experience prior to this delivery?: Yes  Feeding Feeding Type:  (Attempted to latch baby, but too sleepy.) Length of feed: 10 min  LATCH Score/Interventions Latch: Too sleepy or reluctant, no latch achieved, no sucking elicited. Intervention(s): Waking techniques  Audible Swallowing: None Intervention(s): Skin to skin Intervention(s): Skin to skin  Type of Nipple: Everted at rest and after stimulation  Comfort (Breast/Nipple): Soft / non-tender     Hold (Positioning): No assistance needed to correctly position infant at breast.  LATCH Score: 6  Lactation Tools Discussed/Used     Consult Status Consult Status: Follow-up Follow-up type: In-patient    Sherlyn HayJennifer D Kelden Lavallee 11/08/2013, 10:42 PM

## 2013-11-08 NOTE — Anesthesia Preprocedure Evaluation (Signed)
Anesthesia Evaluation  Patient identified by MRN, date of birth, ID band Patient awake    Reviewed: Allergy & Precautions, H&P , Patient's Chart, lab work & pertinent test results  Airway Mallampati: II TM Distance: >3 FB Neck ROM: full    Dental   Pulmonary  breath sounds clear to auscultation        Cardiovascular Rhythm:regular Rate:Normal     Neuro/Psych    GI/Hepatic   Endo/Other    Renal/GU      Musculoskeletal   Abdominal   Peds  Hematology  (+) anemia ,   Anesthesia Other Findings lupus  Reproductive/Obstetrics (+) Pregnancy                           Anesthesia Physical Anesthesia Plan  ASA: III  Anesthesia Plan: Epidural   Post-op Pain Management:    Induction:   Airway Management Planned:   Additional Equipment:   Intra-op Plan:   Post-operative Plan:   Informed Consent: I have reviewed the patients History and Physical, chart, labs and discussed the procedure including the risks, benefits and alternatives for the proposed anesthesia with the patient or authorized representative who has indicated his/her understanding and acceptance.     Plan Discussed with:   Anesthesia Plan Comments:         Anesthesia Quick Evaluation

## 2013-11-08 NOTE — H&P (Signed)
Katherine Harrington is a 33 y.o. female, G2P1001 at 5039 weeks, presenting for medical induction of labor related to lupus erythematosus and thrombosis resulting in lovenox and heparin usage throughout the pregnancy.  Patient Active Problem List   Diagnosis Date Noted  . PFO (patent foramen ovale) repaired 08/05/2011  . Penicillin allergy 08/05/2011  . Supervision of high-risk pregnancy 07/14/2011  . Lupus (systemic lupus erythematosus) 05/03/2011  . History of DVT (deep vein thrombosis) 05/03/2011    History of present pregnancy: Patient entered care at 10 weeks.   EDC of 11/15/2013 was established by LMP of 02/08/2013.   Anatomy scan:  18.2 weeks, with normal findings and an posterior placenta.   Additional US evaluations:  Growth US Q4Wks starting at 24wks-61-66%.   Significant prenatal events:  Weekly visits, Fetal Echo Q2wks, Lovenox & Heparin Prophylaxis, Growth US, Wkly BPP , Plaquenil usage  Last evaluation:  11/03/13 at 38.2wks  OB History   Grav Para Term Preterm Abortions TAB SAB Ect Mult Living   2 1 1  0 0 0 0 0 0 1     Past Medical History  Diagnosis Date  . Lupus (systemic lupus erythematosus) 10/2006  . DVT (deep venous thrombosis)   . Anemia   . Lupus   . Supervision of high-risk pregnancy 07/14/2011  . Patent foramen ovale with atrial septal aneurysm 2011     s/p closure with helix device 04/30/2010 (cardiac cath)   Past Surgical History  Procedure Laterality Date  . Appendectomy    . Cardiac catheterization  04/2010    To close PFO   Family History: family history includes Heart disease in her maternal grandmother. Social History:  reports that she has never smoked. She has never used smokeless tobacco. She reports that she does not drink alcohol or use illicit drugs.   Prenatal Transfer Tool  Maternal Diabetes: No Genetic Screening: Declined Maternal Ultrasounds/Referrals: Normal Fetal Ultrasounds or other Referrals:  Fetal echo, Referred to Materal Fetal  Medicine  Maternal Substance Abuse:  No Significant Maternal Medications:  Meds include: Other:  Lovenox, Heparin at 36wks, Plaquenil, and Baby ASA Significant Maternal Lab Results: Lab values include: Group B Strep negative, Other: VDRL Positive, TPA, IFA-Negative    ROS: Reports active fetus, denies LOF & VB.  Reports braxton hicks type contractions.   Allergies  Allergen Reactions  . Penicillins Rash    childhood reaction       Last menstrual period 02/08/2013.  Chest clear Heart RRR without murmur Abd gravid, NT Pelvic: 2/50/-2 Ext: WNNL  FHR: 135 bpm, Mod Var, -Decels, +Accels UCs:  Irregular   Prenatal labs: ABO, Rh:  A Antibody:  Positive Rubella:   Immune RPR:   Reactive--TPA/IFA-Negative, Repeat VDRL-Negative HBsAg:   Negative 04/12/13 HIV:   Negative GBS:  Negative Sickle cell/Hgb electrophoresis:  N/a Pap:  WNL 10/2011 GC:  Negative  Chlamydia:  Negative Genetic screenings:  Declined Glucola:  Negative Other:  C3, ANA Antibody, ANA titer,     Assessment IUP at 39wks Cat I FT Lupus Erythematosus Thrombosis History of Cardiac Surgery Heparin Prophylaxis  Plan: Admit to [facility] per consult with Dr. Kathie RhodesS. Rivard Routine Labor and Delivery Orders Augmentation Orders, Start Pitocin Continuous Fetal Monitoring  Joyice FasterJessica Lynn [facility] CenterEmlyCNM, MSN 11/08/2013, 7:47 AM

## 2013-11-08 NOTE — Progress Notes (Signed)
  Subjective: Patient reporting increase in contraction intensity and frequency. Requesting epidural.  Objective: BP 110/73  Pulse 76  Temp(Src) 99.1 F (37.3 C) (Oral)  Resp 18  Ht 5\' 1"  (1.549 m)  Wt 144 lb (65.318 kg)  BMI 27.22 kg/m2  LMP 02/08/2013      FHT:  bpm, Mod Var, -Decels, +Accels UC:   Q1-602min, moderate to palpation SVE:   Dilation: 6 Effacement (%): 70 Station: -2 Exam by:: Gerrit HeckJessica Fannye Myer, CNM Pitocin at 128mUn/min  Assessment:  IUP at 39wks Cat I FT Lupus Desires Epidural  Plan: Okay for epidural Continue present mgmt Dr. Kathie RhodesS. Rivard updated.   Joyice FasterJessica Lynn [facility] HospitalEmly 11/08/2013, 1:32 PM

## 2013-11-08 NOTE — Progress Notes (Signed)
Gerrit HeckJessica Emly CNM in to see pt.  Per CNM turn Pitocin up to 398mu/min and leave it

## 2013-11-08 NOTE — Progress Notes (Signed)
Mother unable to hold infant at this time, feeling a little weak.  FOB taking infant skin to skin until going back to mom

## 2013-11-08 NOTE — Progress Notes (Signed)
  Subjective: Patient coping well with contractions.  Using birthing ball and distraction.  Objective: BP 114/74  Pulse 82  Temp(Src) 98.1 F (36.7 C) (Oral)  Resp 20  Ht 5\' 1"  (1.549 m)  Wt 144 lb (65.318 kg)  BMI 27.22 kg/m2  LMP 02/08/2013      FHT: 130s  bpm, Mod Var, -Decels, +Accels UC:   Q2-373mins, mild-mod to palpation SVE:   Dilation: 4 Effacement (%): 50 Station: -2 Exam by:: Gerrit Heckjessica Willona Phariss CNM AROM: Sm, Clear Fluid Pitocin 16mUn/min  Assessment:  IUP at 39 wks Cat I FT Lupus Amniotomy  Plan: Increase pitocin to 8mUn and maintain as long as fetus tolerating well Continue present mgmt Okay for epidural when ready Dr. Kathie RhodesS. Rivard to be updated.  Joyice FasterJessica Lynn [facility] 11/08/2013, 11:53 AM

## 2013-11-09 LAB — CBC
HCT: 36.8 % (ref 36.0–46.0)
Hemoglobin: 12.4 g/dL (ref 12.0–15.0)
MCH: 31.5 pg (ref 26.0–34.0)
MCHC: 33.7 g/dL (ref 30.0–36.0)
MCV: 93.4 fL (ref 78.0–100.0)
Platelets: 135 10*3/uL — ABNORMAL LOW (ref 150–400)
RBC: 3.94 MIL/uL (ref 3.87–5.11)
RDW: 12.4 % (ref 11.5–15.5)
WBC: 9.3 10*3/uL (ref 4.0–10.5)

## 2013-11-09 LAB — T.PALLIDUM AB, IGG: T pallidum Antibodies (TP-PA): 0.13 S/CO (ref <0.90)

## 2013-11-09 NOTE — Anesthesia Postprocedure Evaluation (Signed)
Anesthesia Post Note  Patient: Katherine Harrington  Procedure(s) Performed: * No procedures listed *  Anesthesia type: Epidural  Patient location: Mother/Baby  Post pain: Pain level controlled  Post assessment: Post-op Vital signs reviewed  Last Vitals:  Filed Vitals:   11/09/13 0529  BP: 109/63  Pulse: 64  Temp: 36.7 C  Resp: 17    Post vital signs: Reviewed  Level of consciousness:alert  Complications: No apparent anesthesia complications

## 2013-11-09 NOTE — Lactation Note (Signed)
This note was copied from the chart of Katherine Harrington. Lactation Consultation Note  Patient Name: Katherine Harrington [id number] Date: 11/09/2013 Reason for consult: Follow-up assessment;Other (Comment) (mom requesting earl discharge).  This is mom's second child. Mom reports that her newborn has been latching well, although her first child had difficulty and mom eventually pumped exclusively for 12 months.  Mom reports expressible colostrum and hearing swallows with feedings and baby has met output guidelines for 24 hours of life (first void just an hour ago, 4 stools and 9 feedings of 10-25 minutes each).  Mom is asking for early discharge tonight.  LC reviewed support groups and other [place] services for her after discharge.  LC reinforced cue feedings, minimum feeding amounts and output per 24 hours.   Maternal Data    Feeding Feeding Type: Breast Fed Length of feed: 15 min  LATCH Score/Interventions            Most recent LATCH score=8, per RN assessment          Lactation Tools Discussed/Used   Cue feedings, minimum feeding and output frequency for a breastfed baby  Consult Status Consult Status: PRN Date: 11/10/13 Follow-up type: Call as needed (mom to request OP appt, call w/questions/concerns)    Landis Gandy 11/09/2013, 5:17 PM

## 2013-11-09 NOTE — Progress Notes (Signed)
Shakema M Rosasco  Post Partum Day 1:S/P SVD  Subjective: Patient up ad lib, denies syncope or dizziness. Patient consuming regular diet without issues and controlling pain with percocet. Feeding:  Breast Contraceptive plan:   Mirena  Objective: Blood pressure 109/63, pulse 64, temperature 98.1 F (36.7 C), temperature source Oral, resp. rate 17, height 5\' 1"  (1.549 m), weight 144 lb (65.318 kg), last menstrual period 02/08/2013, SpO2 99.00%, unknown if currently breastfeeding.  Physical Exam:  General: alert, cooperative and fatigued Lochia: appropriate Uterine Fundus: firm, U/-2 Incision: N/A DVT Evaluation: No evidence of DVT seen on physical exam. Negative Homan's sign. No significant calf/ankle edema.   Recent Labs  11/08/13 0900 11/09/13 0553  HGB 12.3 12.4  HCT 35.7* 36.8    Assessment S/P Vaginal delivery day 1 Lupus Breastfeeding  Plan: Okay for discharge tonight if infant cleared by peds Continue current care Dr.S. Rivard updated on patient status   Marlene BastJessica Lynn Doneta Bayman, CNM 11/09/2013, 10:54 AM

## 2013-11-10 ENCOUNTER — Ambulatory Visit: Payer: Self-pay

## 2013-11-10 MED ORDER — IBUPROFEN 600 MG PO TABS: 600.0000 mg | ORAL_TABLET | Freq: Four times a day (QID) | ORAL | Status: AC | PRN

## 2013-11-10 MED ORDER — ENOXAPARIN SODIUM 40 MG/0.4ML ~~LOC~~ SOLN: 40.0000 mg | SUBCUTANEOUS | Status: AC

## 2013-11-10 MED ORDER — OXYCODONE-ACETAMINOPHEN 5-325 MG PO TABS: 1.0000 | ORAL_TABLET | ORAL | Status: AC | PRN

## 2013-11-10 NOTE — Discharge Instructions (Signed)
May use Ibuprophen as needed, but if any increase in bleeding occurs, call MD.   Enoxaparin, Home Use Enoxaparin (Lovenox) injection is a medication used to prevent clots from developing in your veins. Medications such as enoxaparin are called blood thinners or anticoagulants. If blood clots are untreated they could travel to your lungs. This is called a pulmonary embolus. A blood clot in your lungs can be fatal. Caregivers often use anticoagulants such as enoxaparin to prevent clots following surgery. It is also used along with aspirin when the heart is not getting enough blood. Continue the enoxaparin injections as directed by your caregiver. Your caregiver will use blood clotting test results to decide when you can safely stop using enoxaparin injections. If your caregiver prescribes any additional anticoagulant, you must take it exactly as directed. RISKS AND COMPLICATIONS  If you have received recent epidural anesthesia, spinal anesthesia, or a spinal tap while receiving anticoagulants, you are at risk for developing a blood clot in or around the spine. This condition could result in long-term or permanent paralysis.  Because anticoagulants thin your blood, severe bleeding may occur from any tissue or organ. Symptoms of the blood being too thin may include:  Bleeding from the nose or gums that does not stop quickly.  Unusual bruising or bruising easily.  Swelling or pain at an injection site.  A cut that does not stop bleeding within 10 minutes.  Continual nausea for more than 1 day or vomiting blood.  Coughing up blood.  Blood in the urine which may appear as pink, red, or brown urine.  Blood in bowel movements which may appear as red, dark or black stools.  Sudden weakness or numbness of the face, arm, or leg, especially on one side of the body.  Sudden confusion.  Trouble speaking (aphasia) or understanding.  Sudden trouble seeing in one or both eyes.  Sudden trouble  walking.  Dizziness.  Loss of balance or coordination.  Severe pain, such as a headache, joint pain, or back pain.  Fever.  Bruising around the injection sites may be expected.  Platelet drops, known as "thrombocytopenia," can occur with enoxaparin use. A condition called "heparin-induced thrombocytopenia" has been seen. If you have had this condition, you should tell your caregiver. Your caregiver may direct you to have blood tests to monitor this condition.  Do not use if you have allergies to the medication, heparin, or pork products.  Other side effects may include mild local reactions or irritation at the site of injection, pain, bruising, and redness of skin. HOME CARE INSTRUCTIONS You will be instructed by your caregiver how to give enoxaparin injections. 1. Before giving your medication you should make sure the injection is a clear and colorless or pale yellow solution. If your medication becomes discolored or has particles in the bottle, do not use and notify your caregiver. 2. When using the 30 and 40 mg pre-filled syringes, do not expel the air bubble from the syringe before the injection. This makes sure you use all the medication in the syringe. 3. The injections will be given subcutaneously. This means it is given into the fat over the belly (abdomen). It is given deep beneath the skin but not into the muscle. The shots should be injected around the abdominal wall. Change the sites of injection each time. The whole length of the needle should be introduced into a skin fold held between the thumb and forefinger; the skin fold should be held throughout the injection. Do not  rub the injection site after completion of the injection. This increases bruising. Enoxaparin injection pre-filled syringes and graduated pre-filled syringes are available with a system that shields the needle after injection. 4. Inject by pushing the plunger to the bottom of the syringe. 5. Remove the syringe  from the injection site keeping your finger on the plunger rod. Be careful not to stick yourself or others. 6. After injection and the syringe is empty, set off the safety system by firmly pushing the plunger rod. The protective sleeve will automatically cover the needle and you can hear a click. The click means your needle is safely covered. Do not try replacing the needle shield. 7. Get rid of the syringe in the nearest sharps container. 8. Keep your medication safely stored at room temperatures.  Due to the complications of anticoagulants, it is very important that you take your anticoagulant as directed by your caregiver. Anticoagulants need to be taken exactly as instructed. Be sure you understand all your anticoagulant instructions.  Changes in medicines, supplements, diet, and illness can affect your anticoagulation therapy. Be sure to inform your caregivers of any of these changes.  While on anticoagulants, you will need to have blood tests done routinely as directed by your caregivers.  Be careful not to cut yourself when using sharp objects.  Limit physical activities or sports that could result in a fall or cause injury.  It is extremely important that you tell all of your caregivers and dentist that you are taking an anticoagulant, especially if you are injured or plan to have any type of procedure or operation.  Follow up with your laboratory test and caregiver appointments as directed. It is very important to keep your appointments. Not keeping appointments could result in a chronic or permanent injury, pain, or disability. SEEK MEDICAL CARE IF:  You develop any rashes.  You have any worsening of the condition for which you are receiving anticoagulation therapy. SEEK IMMEDIATE MEDICAL CARE IF:  Bleeding from the nose or gums does not stop quickly.  You have unusual bruising or are bruising easily.  Swelling or pain occurs at an injection site.  A cut does not stop  bleeding within 10 minutes.  You have continual nausea for more than 1 day or are vomiting blood.  You are coughing up blood.  You have blood in the urine.  You have dark or black stools.  You have sudden weakness or numbness of the face, arm, or leg, especially on one side of the body.  You have sudden confusion.  You have trouble speaking (aphasia) or understanding.  You have sudden trouble seeing in one or both eyes.  You have sudden trouble walking.  You have dizziness.  You have a loss of balance or coordination.  You have severe pain, such as a headache, joint pain, or back pain.  You have a serious fall or head injury, even if you are not bleeding.  You have an oral temperature above 102 F (38.9 C), not controlled by medicine. ANY OF THESE SYMPTOMS MAY REPRESENT A SERIOUS PROBLEM THAT IS AN EMERGENCY. Do not wait to see if the symptoms will go away. Get medical help right away. Call your local emergency services (911 in [place] DO NOT drive yourself to the hospital. MAKE SURE YOU:  Understand these instructions.  Will watch your condition.  Will get help right away if you are not doing well or get worse. Document Released: 05/15/2004 Document Revised: 10/06/2011 Document Reviewed: 07/14/2005 ExitCare  Patient Information 2014 [place], [place].  Postpartum Care After Vaginal Delivery After you deliver your newborn (postpartum period), the usual stay in the hospital is 24 72 hours. If there were problems with your labor or delivery, or if you have other medical problems, you might be in the hospital longer.  While you are in the hospital, you will receive help and instructions on how to care for yourself and your newborn during the postpartum period.  While you are in the hospital:  Be sure to tell your nurses if you have pain or discomfort, as well as where you feel the pain and what makes the pain worse.  If you had an incision made near your vagina (episiotomy)  or if you had some tearing during delivery, the nurses may put ice packs on your episiotomy or tear. The ice packs may help to reduce the pain and swelling.  If you are breastfeeding, you may feel uncomfortable contractions of your uterus for a couple of weeks. This is normal. The contractions help your uterus get back to normal size.  It is normal to have some bleeding after delivery.  For the first 1 3 days after delivery, the flow is red and the amount may be similar to a period.  It is common for the flow to start and stop.  In the first few days, you may pass some small clots. Let your nurses know if you begin to pass large clots or your flow increases.  Do not  flush blood clots down the toilet before having the nurse look at them.  During the next 3 10 days after delivery, your flow should become more watery and pink or brown-tinged in color.  Ten to fourteen days after delivery, your flow should be a small amount of yellowish-white discharge.  The amount of your flow will decrease over the first few weeks after delivery. Your flow may stop in 6 8 weeks. Most women have had their flow stop by 12 weeks after delivery.  You should change your sanitary pads frequently.  Wash your hands thoroughly with soap and water for at least 20 seconds after changing pads, using the toilet, or before holding or feeding your newborn.  You should feel like you need to empty your bladder within the first 6 8 hours after delivery.  In case you become weak, lightheaded, or faint, call your nurse before you get out of bed for the first time and before you take a shower for the first time.  Within the first few days after delivery, your breasts may begin to feel tender and full. This is called engorgement. Breast tenderness usually goes away within 48 72 hours after engorgement occurs. You may also notice milk leaking from your breasts. If you are not breastfeeding, do not stimulate your breasts. Breast  stimulation can make your breasts produce more milk.  Spending as much time as possible with your newborn is very important. During this time, you and your newborn can feel close and get to know each other. Having your newborn stay in your room (rooming in) will help to strengthen the bond with your newborn. It will give you time to get to know your newborn and become comfortable caring for your newborn.  Your hormones change after delivery. Sometimes the hormone changes can temporarily cause you to feel sad or tearful. These feelings should not last more than a few days. If these feelings last longer than that, you should talk to your caregiver.  If desired, talk to your caregiver about methods of family planning or contraception.  Talk to your caregiver about immunizations. Your caregiver may want you to have the following immunizations before leaving the hospital:  Tetanus, diphtheria, and pertussis (Tdap) or tetanus and diphtheria (Td) immunization. It is very important that you and your family (including grandparents) or others caring for your newborn are up-to-date with the Tdap or Td immunizations. The Tdap or Td immunization can help protect your newborn from getting ill.  Rubella immunization.  Varicella (chickenpox) immunization.  Influenza immunization. You should receive this annual immunization if you did not receive the immunization during your pregnancy. Document Released: 05/11/2007 Document Revised: 04/07/2012 Document Reviewed: 03/10/2012 [facility] Patient Information 2014 [place], [place].

## 2013-11-10 NOTE — Discharge Summary (Signed)
  Vaginal Delivery Discharge Summary  Katherine Harrington  DOB:    12/26/1980 MRN:    [id number] CSN:    [id number]  Date of admission:                  11/08/13  Date of discharge:                   11/08/13  Procedures this admission:   SVB, Induction of labor with pitocin, epidural analgesia, Lovenox prophylaxis pp  Date of Delivery: 11/08/13  Newborn Data:  Live born female  Birth Weight: 6 lb 10.9 oz (3030 g) APGAR: 9, 9  Home with mother. Name: Katherine Harrington  History of Present Illness:  [name] is a 33 y.o. female, [id number], who presents at 7685w0d weeks gestation. [facility] She was admitted induction of labor due to lupus, hx DVT. Her pregnancy has been complicated by:  Patient Active Problem List   Diagnosis Date Noted  . Pregnancy, high-risk 11/08/2013  . PFO (patent foramen ovale) repaired 08/05/2011  . Penicillin allergy 08/05/2011  . Supervision of high-risk pregnancy 07/14/2011  . Lupus (systemic lupus erythematosus) 05/03/2011  . History of DVT (deep vein thrombosis) 05/03/2011     Hospital Course:  Admitted 11/08/13 for induction due to lupus and hx DVT, currently on heparin. Negative GBS. Progressed well with pitocin. Utilized epidural for pain management.  Delivery was performed by Gerrit HeckJessica Emly, CNM, without complication. Patient and baby tolerated the procedure without difficulty, with no laceration noted. Infant status was stable and remained in room with mother.  Mother and infant then had an uncomplicated postpartum course, with breast feeding going well. Patient was begun on Lovenox 40 mg SQ for prophylaxis, and continued ASA 81 mg and Plaquenil daily.  Mom's physical exam was WNL, and she was discharged home in stable condition. Contraception plan was IUD at 6 weeks.  She received adequate benefit from po pain  medications.   Feeding:  breast  Contraception:  IUD  Discharge hemoglobin:  HGB  Date Value Ref Range Status  09/04/2010 14.1  11.6 - 15.9 g/dL Final     Hemoglobin  Date Value Ref Range Status  11/09/2013 12.4  12.0 - 15.0 g/dL Final  0/10/93239/16/2014 55.713.0   Final     HCT  Date Value Ref Range Status  11/09/2013 36.8  36.0 - 46.0 % Final  04/12/2013 37   Final  09/04/2010 40.3  34.8 - 46.6 % Final    Discharge Physical Exam:   General: alert Lochia: appropriate Uterine Fundus: firm Incision: Intact perineum DVT Evaluation: No evidence of DVT seen on physical exam. Negative Homan's sign.  Intrapartum Procedures: spontaneous vaginal delivery Postpartum Procedures: Lovenox 40 mg SQ daily Complications-Operative and Postpartum: none  Discharge Diagnoses: Term Pregnancy-delivered and Lupus, hx DVT  Discharge Information:  Activity:           pelvic rest Diet:                routine Medications: Ibuprofen, Percocet and Lovenox 40 mg SQ daily, ASA 81 mg daily, Plaquenil daily.  To continue Lovenox x 6 weeks. Condition:      stable Instructions:     Discharge to: home     Katherine Harrington 11/10/2013

## 2013-11-10 NOTE — Lactation Note (Signed)
This note was copied from the chart of Katherine Harrington. Lactation Consultation Note  Patient Name: Katherine Harrington Today's Date: 11/10/2013  Follow-up assessment just before discharge, baby 5544 hours old. Mom reports no problems with bread feeding. Discussed engorgement prevention/treatment. Mom has a DEBP at home. Mom pumped with first child, 33 years old. Referred mom to baby and me booklet for storage of breast milk and number of diapers to expect each day. Mom aware of OP/BFSG services, community resources information, and has [facility]. Enc mom to call with any questions for support.   Maternal Data    Feeding    [facility] Score/Interventions                      Lactation Tools Discussed/Used     Consult Status      Sherlyn HayJennifer D Colby Catanese 11/10/2013, 12:06 PM

## 2014-04-21 ENCOUNTER — Encounter: Payer: Self-pay | Admitting: *Deleted

## 2014-05-29 ENCOUNTER — Encounter: Payer: Self-pay | Admitting: *Deleted

## 2015-11-23 ENCOUNTER — Other Ambulatory Visit (HOSPITAL_COMMUNITY)
Admission: RE | Admit: 2015-11-23 | Discharge: 2015-11-23 | Disposition: A | Discharge status: Home / Self Care | Payer: PRIVATE HEALTH INSURANCE | Source: Ambulatory Visit | Attending: Family Medicine | Admitting: Family Medicine

## 2015-11-23 ENCOUNTER — Other Ambulatory Visit: Payer: Self-pay | Admitting: Family Medicine

## 2015-11-23 DIAGNOSIS — Z1151 Encounter for screening for human papillomavirus (HPV): Secondary | ICD-10-CM | POA: Diagnosis not present

## 2015-11-23 DIAGNOSIS — Z01411 Encounter for gynecological examination (general) (routine) with abnormal findings: Secondary | ICD-10-CM | POA: Diagnosis present

## 2015-11-26 LAB — CYTOLOGY - PAP

## 2018-12-21 ENCOUNTER — Other Ambulatory Visit: Payer: Self-pay

## 2018-12-21 ENCOUNTER — Emergency Department (HOSPITAL_COMMUNITY)
Admission: EM | Admit: 2018-12-21 | Discharge: 2018-12-21 | Disposition: A | Discharge status: Home / Self Care | Payer: PRIVATE HEALTH INSURANCE | Attending: Emergency Medicine | Admitting: Emergency Medicine

## 2018-12-21 ENCOUNTER — Emergency Department (HOSPITAL_COMMUNITY): Payer: PRIVATE HEALTH INSURANCE

## 2018-12-21 ENCOUNTER — Encounter (HOSPITAL_COMMUNITY): Payer: Self-pay

## 2018-12-21 DIAGNOSIS — R102 Pelvic and perineal pain: Secondary | ICD-10-CM | POA: Diagnosis not present

## 2018-12-21 DIAGNOSIS — Z7982 Long term (current) use of aspirin: Secondary | ICD-10-CM | POA: Insufficient documentation

## 2018-12-21 DIAGNOSIS — Z79899 Other long term (current) drug therapy: Secondary | ICD-10-CM | POA: Insufficient documentation

## 2018-12-21 DIAGNOSIS — R1011 Right upper quadrant pain: Secondary | ICD-10-CM | POA: Diagnosis present

## 2018-12-21 DIAGNOSIS — R1084 Generalized abdominal pain: Secondary | ICD-10-CM

## 2018-12-21 LAB — CBC WITH DIFFERENTIAL/PLATELET
Abs Immature Granulocytes: 0.01 10*3/uL (ref 0.00–0.07)
Basophils Absolute: 0 10*3/uL (ref 0.0–0.1)
Basophils Relative: 0 %
Eosinophils Absolute: 0 10*3/uL (ref 0.0–0.5)
Eosinophils Relative: 0 %
HCT: 40.8 % (ref 36.0–46.0)
Hemoglobin: 13.4 g/dL (ref 12.0–15.0)
Immature Granulocytes: 0 %
Lymphocytes Relative: 31 %
Lymphs Abs: 2.1 10*3/uL (ref 0.7–4.0)
MCH: 31.8 pg (ref 26.0–34.0)
MCHC: 32.8 g/dL (ref 30.0–36.0)
MCV: 96.9 fL (ref 80.0–100.0)
Monocytes Absolute: 0.5 10*3/uL (ref 0.1–1.0)
Monocytes Relative: 7 %
Neutro Abs: 4.2 10*3/uL (ref 1.7–7.7)
Neutrophils Relative %: 62 %
Platelets: 174 10*3/uL (ref 150–400)
RBC: 4.21 MIL/uL (ref 3.87–5.11)
RDW: 12.7 % (ref 11.5–15.5)
WBC: 6.8 10*3/uL (ref 4.0–10.5)
nRBC: 0 % (ref 0.0–0.2)

## 2018-12-21 LAB — COMPREHENSIVE METABOLIC PANEL
ALT: 22 U/L (ref 0–44)
AST: 24 U/L (ref 15–41)
Albumin: 4.7 g/dL (ref 3.5–5.0)
Alkaline Phosphatase: 36 U/L — ABNORMAL LOW (ref 38–126)
Anion gap: 10 (ref 5–15)
BUN: 12 mg/dL (ref 6–20)
CO2: 24 mmol/L (ref 22–32)
Calcium: 9 mg/dL (ref 8.9–10.3)
Chloride: 104 mmol/L (ref 98–111)
Creatinine, Ser: 0.72 mg/dL (ref 0.44–1.00)
GFR calc Af Amer: >60 mL/min (ref >60)
GFR calc non Af Amer: >60 mL/min (ref >60)
Glucose, Bld: 98 mg/dL (ref 70–99)
Potassium: 3.4 mmol/L — ABNORMAL LOW (ref 3.5–5.1)
Sodium: 138 mmol/L (ref 135–145)
Total Bilirubin: 0.4 mg/dL (ref 0.3–1.2)
Total Protein: 7.1 g/dL (ref 6.5–8.1)

## 2018-12-21 LAB — I-STAT BETA HCG BLOOD, ED (MC, WL, AP ONLY): I-stat hCG, quantitative: <5 m[IU]/mL (ref <5)

## 2018-12-21 LAB — PROTIME-INR
INR: 2.3 — ABNORMAL HIGH (ref 0.8–1.2)
Prothrombin Time: 24.5 seconds — ABNORMAL HIGH (ref 11.4–15.2)

## 2018-12-21 LAB — LIPASE, BLOOD: Lipase: 30 U/L (ref 11–51)

## 2018-12-21 MED ORDER — IOHEXOL 300 MG/ML  SOLN
100.0000 mL | Freq: Once | INTRAMUSCULAR | Status: AC | PRN
  Administered 2018-12-21: 100 mL via INTRAVENOUS
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
## 2018-12-21 NOTE — ED Provider Notes (Signed)
[facility]-EMERGENCY DEPT Provider Note   CSN: [id number] Arrival date & time: 12/21/18  1703    History   Chief Complaint Chief Complaint  Patient presents with  . Abdominal Pain    HPI Katherine Harrington is a 38 y.o. female.     38 yo F with a chief complaints of abdominal pain after striking her abdomen with the handle of a wheelbarrow.  The patient was pushing it and got stuck in some stones and she walked into the handlebar.  This happened about 3 to 4 days ago.  The patient is on Coumadin and since then has had crampy lower abdominal pain.  Since that had persisted she went to her family doctor who was concerned with her having a possible intra-abdominal hemorrhage secondary to Coumadin use.  They recommended that she come to the ED for lab work and a CT scan versus ultrasound.  The history is provided by the patient.  Abdominal Pain  Pain location:  Generalized Pain quality: aching and cramping   Pain radiates to:  Does not radiate Pain severity:  Moderate Onset quality:  Sudden Duration:  2 days Timing:  Constant Progression:  Worsening Chronicity:  New Relieved by:  Nothing Worsened by:  Nothing Ineffective treatments:  None tried Associated symptoms: no chest pain, no chills, no dysuria, no fever, no nausea, no shortness of breath and no vomiting     Past Medical History:  Diagnosis Date  . Anemia   . Conjunctivitis   . DVT (deep venous thrombosis) (HCC)   . Lupus (HCC)   . Lupus (systemic lupus erythematosus) (HCC) 10/2006  . Patent foramen ovale with atrial septal aneurysm 2011    s/p closure with helix device 04/30/2010 (cardiac cath)  . Pericarditis    pleurisy  . Supervision of high-risk pregnancy 07/14/2011    Patient Active Problem List   Diagnosis Date Noted  . Vaginal delivery 11/10/2013  . PFO (patent foramen ovale) repaired 08/05/2011  . Penicillin allergy 08/05/2011  . Lupus (systemic lupus erythematosus) (HCC) 05/03/2011   . History of DVT (deep vein thrombosis) 05/03/2011    Past Surgical History:  Procedure Laterality Date  . APPENDECTOMY    . CARDIAC CATHETERIZATION  04/2010   To close PFO     OB History    Gravida  2   Para  2   Term  2   Preterm  0   AB  0   Living  2     SAB  0   TAB  0   Ectopic  0   Multiple  0   Live Births  2            Home Medications    Prior to Admission medications   Medication Sig Start Date End Date Taking? Authorizing Provider  aspirin 81 MG tablet Take 81 mg by mouth daily.    [provider]  diphenhydrAMINE (BENADRYL) 25 mg capsule Take 25 mg by mouth every 6 (six) hours as needed for allergies or sleep.    [provider]  enoxaparin (LOVENOX) 40 MG/0.4ML injection Inject 0.4 mLs (40 mg total) into the skin daily. 11/10/13   Nigel BridgemanLatham, Vicki, CNM  hydroxychloroquine (PLAQUENIL) 200 MG tablet Take 200 mg by mouth daily.     [provider]  ibuprofen (ADVIL,MOTRIN) 600 MG tablet Take 1 tablet (600 mg total) by mouth every 6 (six) hours as needed (pain scale < 4). 11/10/13   Nigel BridgemanLatham, Vicki, CNM  oxyCODONE-acetaminophen (PERCOCET/ROXICET) 5-325 MG per tablet Take 1-2 tablets by mouth every 4 (four) hours as needed for severe pain (moderate - severe pain). 11/10/13   Nigel Bridgeman, CNM  Prenatal Vit-Fe Fumarate-FA (PRENATAL MULTIVITAMIN) TABS Take 1 tablet by mouth daily.    [provider]    Family History Family History  Problem Relation Age of Onset  . Hypothyroidism Mother   . Heart disease Maternal Grandmother     Social History Social History   Tobacco Use  . Smoking status: Never Smoker  . Smokeless tobacco: Never Used  Substance Use Topics  . Alcohol use: No  . Drug use: No     Allergies   Penicillins   Review of Systems Review of Systems  Constitutional: Negative for chills and fever.  HENT: Negative for congestion and rhinorrhea.   Eyes: Negative for redness and visual disturbance.   Respiratory: Negative for shortness of breath and wheezing.   Cardiovascular: Negative for chest pain and palpitations.  Gastrointestinal: Positive for abdominal pain. Negative for nausea and vomiting.  Genitourinary: Negative for dysuria and urgency.  Musculoskeletal: Negative for arthralgias and myalgias.  Skin: Negative for pallor and wound.  Neurological: Negative for dizziness and headaches.     Physical Exam Updated Vital Signs BP 113/74 (BP Location: Left Arm)   Pulse 65   Temp 98.7 F (37.1 C) (Oral)   Resp 16   Ht 5\' 1"  (1.549 m)   Wt 54.4 kg   SpO2 100%   BMI 22.67 kg/m   Physical Exam Vitals signs and nursing note reviewed.  Constitutional:      General: She is not in acute distress.    Appearance: She is well-developed. She is not diaphoretic.  HENT:     Head: Normocephalic and atraumatic.  Eyes:     Pupils: Pupils are equal, round, and reactive to light.  Neck:     Musculoskeletal: Normal range of motion and neck supple.  Cardiovascular:     Rate and Rhythm: Normal rate and regular rhythm.     Heart sounds: No murmur. No friction rub. No gallop.   Pulmonary:     Effort: Pulmonary effort is normal.     Breath sounds: No wheezing or rales.  Abdominal:     General: There is no distension.     Palpations: Abdomen is soft.     Tenderness: There is abdominal tenderness ( Mild diffuse worse to the lower aspect of the abdomen).  Musculoskeletal:        General: No tenderness.  Skin:    General: Skin is warm and dry.  Neurological:     Mental Status: She is alert and oriented to person, place, and time.  Psychiatric:        Behavior: Behavior normal.      ED Treatments / Results  Labs (all labs ordered are listed, but only abnormal results are displayed) Labs Reviewed  PROTIME-INR - Abnormal; Notable for the following components:      Result Value   Prothrombin Time 24.5 (*)    INR 2.3 (*)    All other components within normal limits   COMPREHENSIVE METABOLIC PANEL - Abnormal; Notable for the following components:   Potassium 3.4 (*)    Alkaline Phosphatase 36 (*)    All other components within normal limits  CBC WITH DIFFERENTIAL/PLATELET  LIPASE, BLOOD  I-STAT BETA HCG BLOOD, ED ([facility], [facility], [facility] ONLY)    EKG None  Radiology Ct Abdomen Pelvis W Contrast  Result  Date: 12/21/2018 CLINICAL DATA:  Blunt trauma to the right upper quadrant earlier today with pain, initial encounter EXAM: CT ABDOMEN AND PELVIS WITH CONTRAST TECHNIQUE: Multidetector CT imaging of the abdomen and pelvis was performed using the standard protocol following bolus administration of intravenous contrast. CONTRAST:  OMNIPAQUE 300 COMPARISON:  None. FINDINGS: Lower chest: No acute abnormality. Hepatobiliary: No focal liver abnormality is seen. No gallstones, gallbladder wall thickening, or biliary dilatation. Pancreas: Unremarkable. No pancreatic ductal dilatation or surrounding inflammatory changes. Spleen: Normal in size without focal abnormality. Adrenals/Urinary Tract: Adrenal glands are within normal limits. Kidneys are well visualized bilaterally. No renal calculi or obstructive changes are noted. Bladder is partially distended. Stomach/Bowel: The appendix has been surgically removed. No obstructive or inflammatory changes of large or small bowel are noted. Vascular/Lymphatic: No significant vascular findings are present. No enlarged abdominal or pelvic lymph nodes. Reproductive: Uterus and bilateral adnexa are unremarkable. IUD is noted in place. Other: No abdominal wall hernia or abnormality. No abdominopelvic ascites. Musculoskeletal: No acute or significant osseous findings. IMPRESSION: No acute abnormality noted. Electronically Signed   By: Alcide Clever M.D.   On: 12/21/2018 22:40    Procedures Procedures (including critical care time)  Medications Ordered in ED Medications  iohexol (OMNIPAQUE) 300 MG/ML solution 100 mL (100 mLs Intravenous  Contrast Given 12/21/18 2218)     Initial Impression / Assessment and Plan / ED Course  I have reviewed the triage vital signs and the nursing notes.  Pertinent labs & imaging results that were available during my care of the patient were reviewed by me and considered in my medical decision making (see chart for details).        38 yo F with a chief complaint of diffuse abdominal pain.  This is after she walked into the handle of a wheelbarrow.  CT scan is negative.  Lab work is unremarkable.  Not pregnant no leukocytosis LFTs lipase are normal.  Discharge home.  10:51 PM:  I have discussed the diagnosis/risks/treatment options with the patient and believe the pt to be eligible for discharge home to follow-up with PCP. We also discussed returning to the ED immediately if new or worsening sx occur. We discussed the sx which are most concerning (e.g., sudden worsening pain, fever, inability to tolerate by mouth) that necessitate immediate return. Medications administered to the patient during their visit and any new prescriptions provided to the patient are listed below.  Medications given during this visit Medications  iohexol (OMNIPAQUE) 300 MG/ML solution 100 mL (100 mLs Intravenous Contrast Given 12/21/18 2218)     The patient appears reasonably screen and/or stabilized for discharge and I doubt any other medical condition or other [facility] requiring further screening, evaluation, or treatment in the ED at this time prior to discharge.    Final Clinical Impressions(s) / ED Diagnoses   Final diagnoses:  Generalized abdominal pain    ED Discharge Orders    None       Melene Plan, DO 12/21/18 2251

## 2018-12-21 NOTE — ED Notes (Signed)
Pt given gown to change into.

## 2018-12-21 NOTE — Discharge Instructions (Signed)
Follow up with your family doc.  Return for worsening pain, fever.  °

## 2019-10-15 IMAGING — CT CT ABDOMEN AND PELVIS WITH CONTRAST
2 of 5 series · 16 of 46 positions shown, 18 images · IV contrast (omnipaque)
Comparison: None.

CLINICAL DATA: Blunt trauma to the right upper quadrant earlier
today with pain, initial encounter

EXAM:
CT ABDOMEN AND PELVIS WITH CONTRAST
TECHNIQUE: Multidetector CT imaging of the abdomen and pelvis was performed
using the standard protocol following bolus administration of
intravenous contrast.
CONTRAST:  100mL OMNIPAQUE 300

[Series 2: axial st · axial · 0.60mm/px · z∈[+1039,+1354]mm · 13 of 75 slices shown, 15 images]
[im 6/75  soft-tissue]
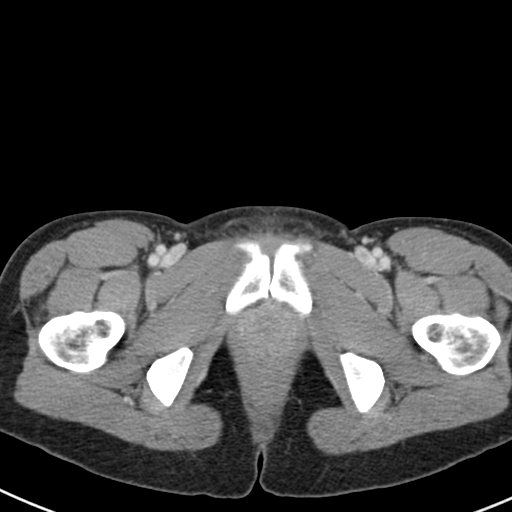
[im 6/75  bone]
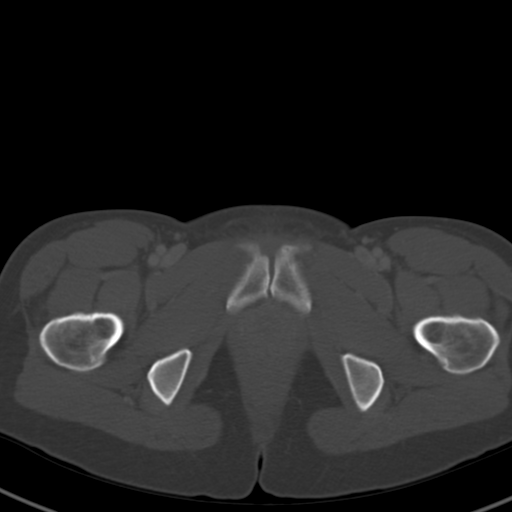
[im 11/75  soft-tissue]
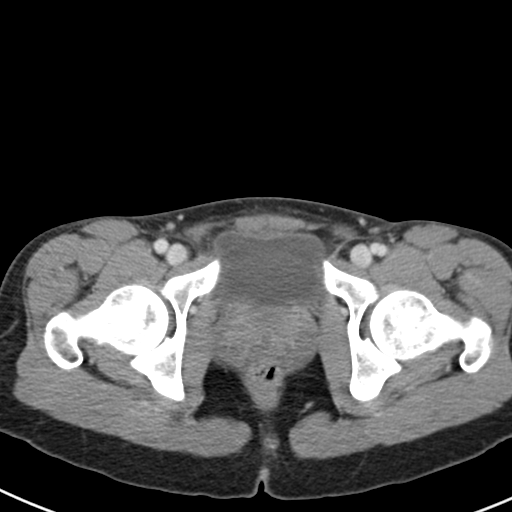
[im 16/75  soft-tissue]
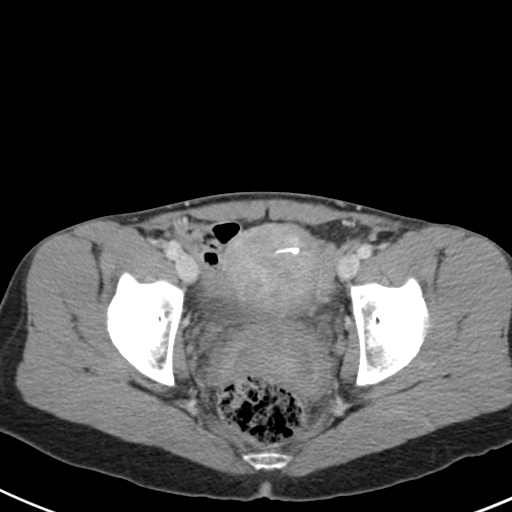
[im 22/75  soft-tissue]
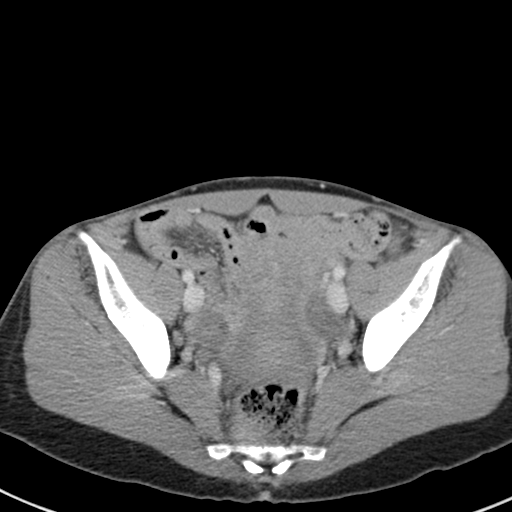
[im 27/75  soft-tissue]
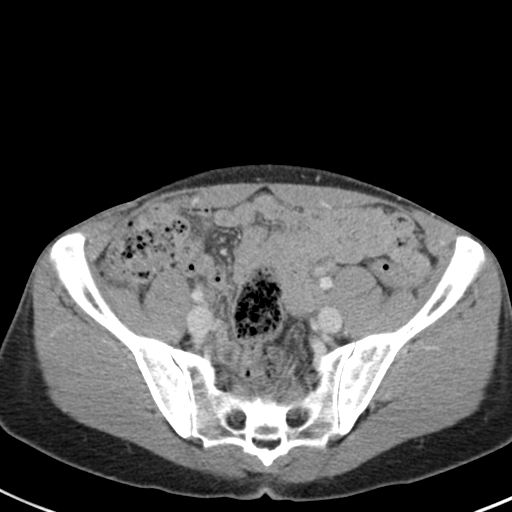
[im 32/75  soft-tissue]
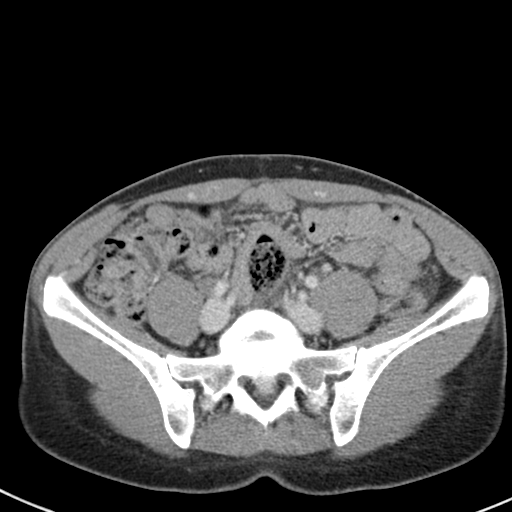
[im 38/75  soft-tissue]
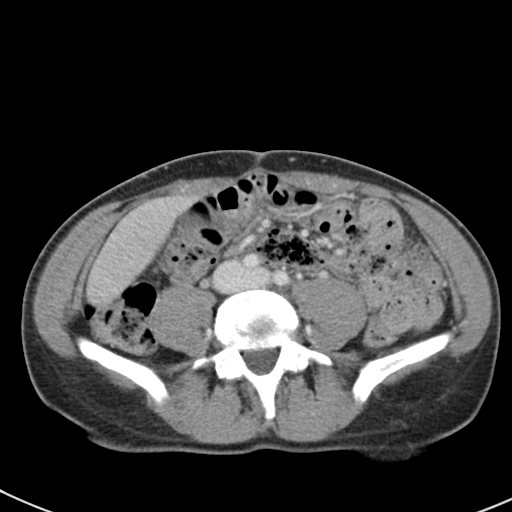
[im 43/75  soft-tissue]
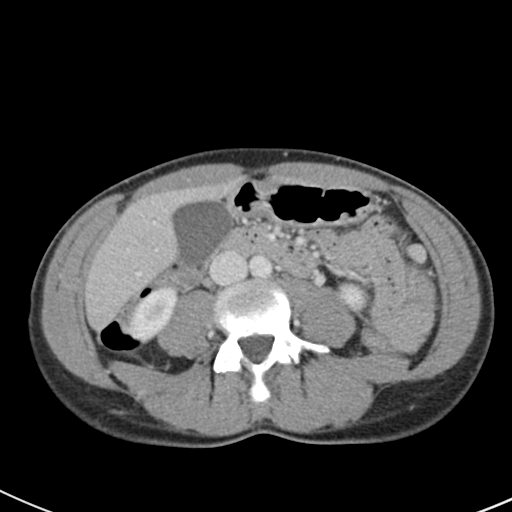
[im 48/75  soft-tissue]
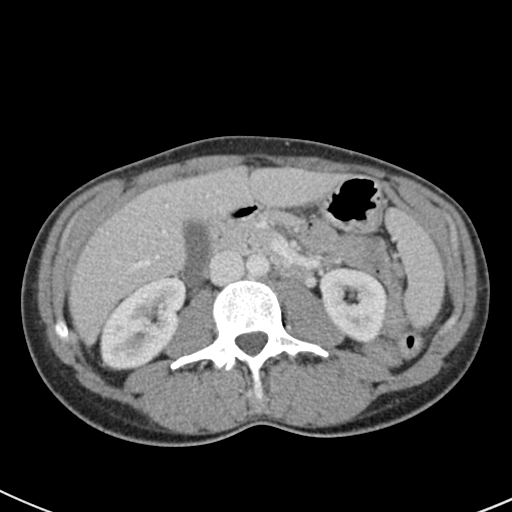
[im 48/75  bone]
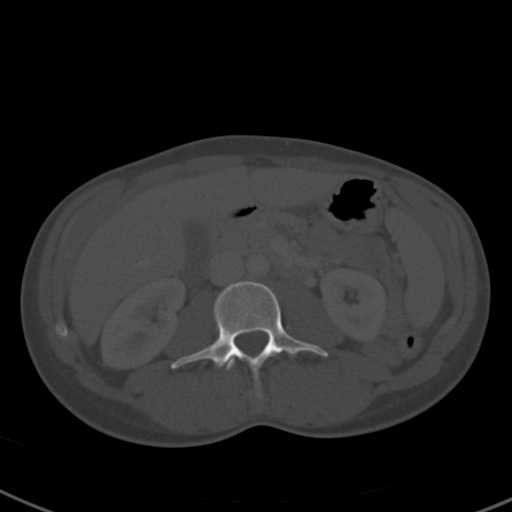
[im 53/75  soft-tissue]
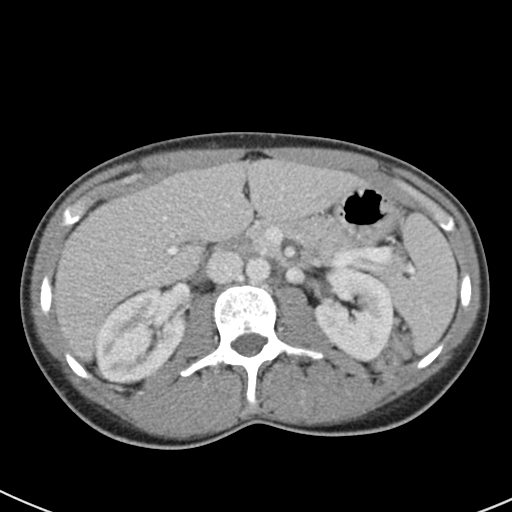
[im 59/75  soft-tissue]
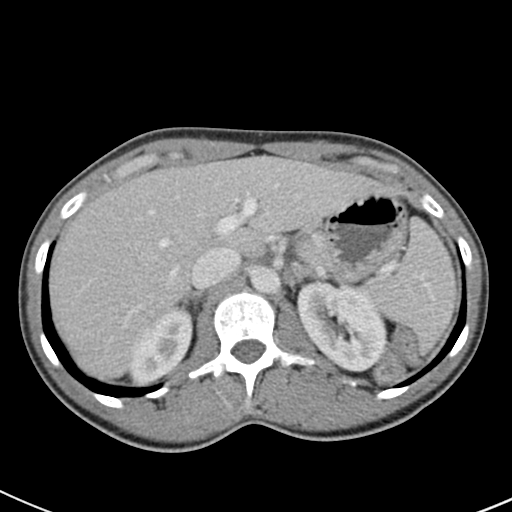
[im 64/75  soft-tissue]
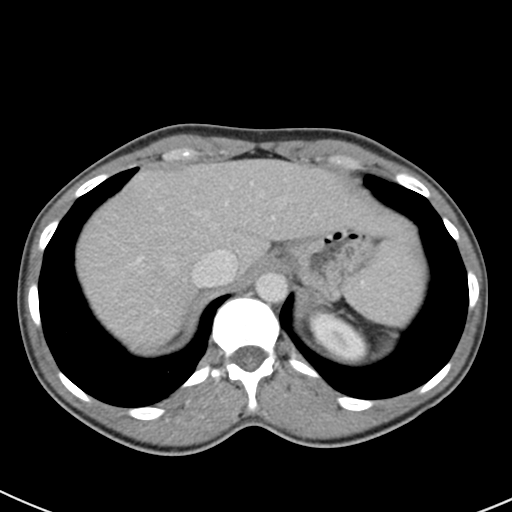
[im 69/75  soft-tissue]
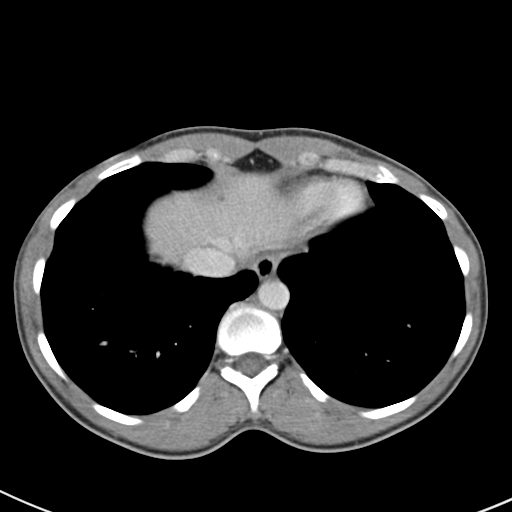

[Series 5: coronal st · coronal · 0.57mm/px · 3 of 92 slices shown]
[im 31/92  soft-tissue]
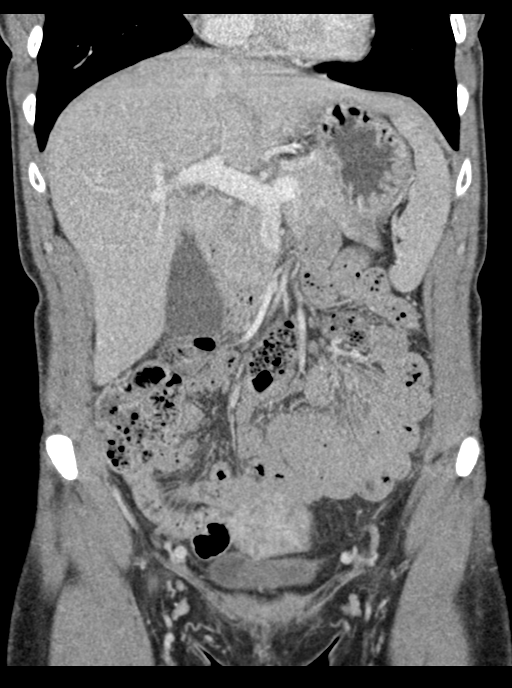
[im 41/92  soft-tissue]
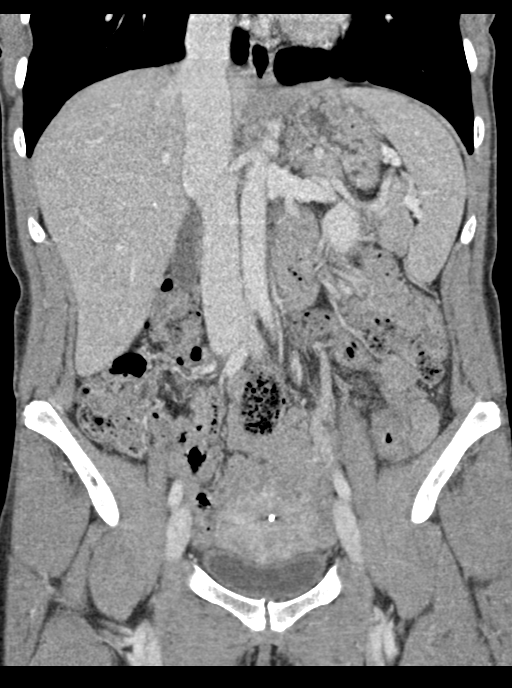
[im 51/92  soft-tissue]
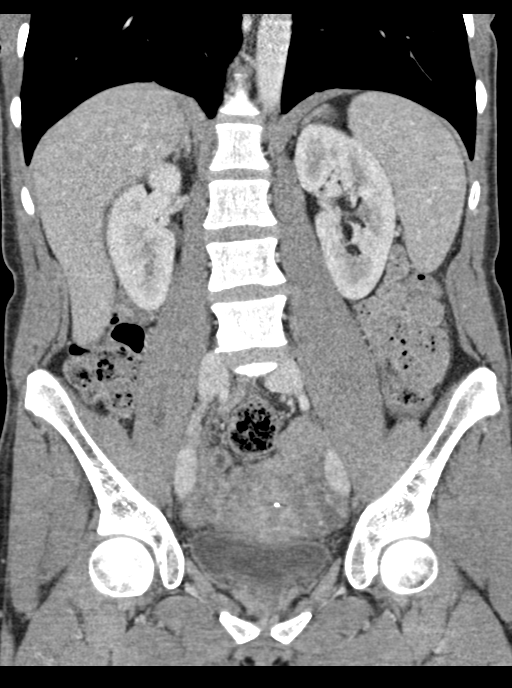

[16 of 46 positions shown; findings below may reference images not displayed]

FINDINGS: Lower chest: No acute abnormality.

Hepatobiliary: No focal liver abnormality is seen. No gallstones,
gallbladder wall thickening, or biliary dilatation.

Pancreas: Unremarkable. No pancreatic ductal dilatation or
surrounding inflammatory changes.

Spleen: Normal in size without focal abnormality.

Adrenals/Urinary Tract: Adrenal glands are within normal limits.
Kidneys are well visualized bilaterally. No renal calculi or
obstructive changes are noted. Bladder is partially distended.

Stomach/Bowel: The appendix has been surgically removed. No
obstructive or inflammatory changes of large or small bowel are
noted.

Vascular/Lymphatic: No significant vascular findings are present. No
enlarged abdominal or pelvic lymph nodes.

Reproductive: Uterus and bilateral adnexa are unremarkable. IUD is
noted in place.

Other: No abdominal wall hernia or abnormality. No abdominopelvic
ascites.

Musculoskeletal: No acute or significant osseous findings.
IMPRESSION: No acute abnormality noted.
